# Patient Record
Sex: Male | Born: 1940 | Race: White | Hispanic: No | Smoking: Former smoker
Health system: Southern US, Community
[De-identification: ages and names within clinical notes are randomized; demographics above are authoritative.]

## PROBLEM LIST (undated history)

## (undated) DIAGNOSIS — N200 Calculus of kidney: Secondary | ICD-10-CM

## (undated) DIAGNOSIS — Z86010 Personal history of colonic polyps: Secondary | ICD-10-CM

## (undated) DIAGNOSIS — C3492 Malignant neoplasm of unspecified part of left bronchus or lung: Secondary | ICD-10-CM

## (undated) DIAGNOSIS — R918 Other nonspecific abnormal finding of lung field: Principal | ICD-10-CM

## (undated) DIAGNOSIS — K648 Other hemorrhoids: Secondary | ICD-10-CM

## (undated) DIAGNOSIS — F410 Panic disorder [episodic paroxysmal anxiety] without agoraphobia: Secondary | ICD-10-CM

## (undated) DIAGNOSIS — G8929 Other chronic pain: Secondary | ICD-10-CM

## (undated) DIAGNOSIS — M1990 Unspecified osteoarthritis, unspecified site: Secondary | ICD-10-CM

## (undated) DIAGNOSIS — R51 Headache: Secondary | ICD-10-CM

## (undated) DIAGNOSIS — Z860101 Personal history of adenomatous and serrated colon polyps: Secondary | ICD-10-CM

## (undated) DIAGNOSIS — R55 Syncope and collapse: Secondary | ICD-10-CM

## (undated) DIAGNOSIS — K861 Other chronic pancreatitis: Secondary | ICD-10-CM

## (undated) DIAGNOSIS — F1020 Alcohol dependence, uncomplicated: Secondary | ICD-10-CM

## (undated) DIAGNOSIS — R109 Unspecified abdominal pain: Secondary | ICD-10-CM

## (undated) DIAGNOSIS — Z515 Encounter for palliative care: Secondary | ICD-10-CM

## (undated) DIAGNOSIS — K5790 Diverticulosis of intestine, part unspecified, without perforation or abscess without bleeding: Secondary | ICD-10-CM

## (undated) DIAGNOSIS — K219 Gastro-esophageal reflux disease without esophagitis: Secondary | ICD-10-CM

## (undated) DIAGNOSIS — R519 Headache, unspecified: Secondary | ICD-10-CM

## (undated) DIAGNOSIS — M47816 Spondylosis without myelopathy or radiculopathy, lumbar region: Secondary | ICD-10-CM

## (undated) DIAGNOSIS — IMO0001 Reserved for inherently not codable concepts without codable children: Secondary | ICD-10-CM

## (undated) DIAGNOSIS — C61 Malignant neoplasm of prostate: Secondary | ICD-10-CM

## (undated) HISTORY — DX: Malignant neoplasm of prostate: C61

## (undated) HISTORY — DX: Other hemorrhoids: K64.8

## (undated) HISTORY — DX: Personal history of adenomatous and serrated colon polyps: Z86.0101

## (undated) HISTORY — DX: Unspecified abdominal pain: R10.9

## (undated) HISTORY — DX: Other chronic pain: G89.29

## (undated) HISTORY — DX: Unspecified osteoarthritis, unspecified site: M19.90

## (undated) HISTORY — DX: Headache: R51

## (undated) HISTORY — DX: Headache, unspecified: R51.9

## (undated) HISTORY — DX: Gastro-esophageal reflux disease without esophagitis: K21.9

## (undated) HISTORY — DX: Malignant neoplasm of unspecified part of left bronchus or lung: C34.92

## (undated) HISTORY — DX: Other nonspecific abnormal finding of lung field: R91.8

## (undated) HISTORY — DX: Spondylosis without myelopathy or radiculopathy, lumbar region: M47.816

## (undated) HISTORY — PX: WOUND EXPLORATION: SHX2669

## (undated) HISTORY — DX: Other chronic pancreatitis: K86.1

## (undated) HISTORY — DX: Panic disorder (episodic paroxysmal anxiety): F41.0

## (undated) HISTORY — PX: TONSILLECTOMY: SUR1361

## (undated) HISTORY — DX: Calculus of kidney: N20.0

## (undated) HISTORY — DX: Personal history of colonic polyps: Z86.010

## (undated) HISTORY — PX: CHOLECYSTECTOMY: SHX55

## (undated) HISTORY — DX: Syncope and collapse: R55

## (undated) HISTORY — DX: Diverticulosis of intestine, part unspecified, without perforation or abscess without bleeding: K57.90

## (undated) HISTORY — DX: Alcohol dependence, uncomplicated: F10.20

---

## 2000-01-08 ENCOUNTER — Ambulatory Visit (HOSPITAL_COMMUNITY): Admission: RE | Admit: 2000-01-08 | Discharge: 2000-01-08 | Payer: Self-pay | Admitting: Gastroenterology

## 2000-01-08 ENCOUNTER — Encounter (INDEPENDENT_AMBULATORY_CARE_PROVIDER_SITE_OTHER): Payer: Self-pay | Admitting: Specialist

## 2000-02-09 ENCOUNTER — Emergency Department (HOSPITAL_COMMUNITY): Admission: EM | Admit: 2000-02-09 | Discharge: 2000-02-09 | Payer: Self-pay | Admitting: Emergency Medicine

## 2000-02-09 ENCOUNTER — Encounter: Payer: Self-pay | Admitting: Emergency Medicine

## 2000-03-16 ENCOUNTER — Encounter: Admission: RE | Admit: 2000-03-16 | Discharge: 2000-03-16 | Payer: Self-pay | Admitting: Urology

## 2000-03-16 ENCOUNTER — Encounter: Payer: Self-pay | Admitting: Urology

## 2000-03-30 ENCOUNTER — Other Ambulatory Visit: Admission: RE | Admit: 2000-03-30 | Discharge: 2000-03-30 | Payer: Self-pay | Admitting: Urology

## 2000-03-30 ENCOUNTER — Encounter (INDEPENDENT_AMBULATORY_CARE_PROVIDER_SITE_OTHER): Payer: Self-pay | Admitting: Specialist

## 2000-04-07 ENCOUNTER — Encounter: Payer: Self-pay | Admitting: Urology

## 2000-04-07 ENCOUNTER — Encounter: Admission: RE | Admit: 2000-04-07 | Discharge: 2000-04-07 | Payer: Self-pay | Admitting: Urology

## 2000-04-30 ENCOUNTER — Encounter: Admission: RE | Admit: 2000-04-30 | Discharge: 2000-07-29 | Payer: Self-pay | Admitting: Radiation Oncology

## 2000-08-02 ENCOUNTER — Encounter: Admission: RE | Admit: 2000-08-02 | Discharge: 2000-10-31 | Payer: Self-pay | Admitting: Radiation Oncology

## 2001-04-01 ENCOUNTER — Encounter: Admission: RE | Admit: 2001-04-01 | Discharge: 2001-04-01 | Payer: Self-pay | Admitting: Internal Medicine

## 2001-04-01 ENCOUNTER — Encounter: Payer: Self-pay | Admitting: Internal Medicine

## 2001-04-11 ENCOUNTER — Encounter: Payer: Self-pay | Admitting: Surgery

## 2001-04-11 ENCOUNTER — Encounter: Admission: RE | Admit: 2001-04-11 | Discharge: 2001-04-11 | Payer: Self-pay | Admitting: Surgery

## 2001-04-22 ENCOUNTER — Encounter (INDEPENDENT_AMBULATORY_CARE_PROVIDER_SITE_OTHER): Payer: Self-pay | Admitting: Specialist

## 2001-04-22 ENCOUNTER — Observation Stay (HOSPITAL_COMMUNITY): Admission: RE | Admit: 2001-04-22 | Discharge: 2001-04-25 | Payer: Self-pay | Admitting: Surgery

## 2001-04-23 ENCOUNTER — Encounter: Payer: Self-pay | Admitting: Surgery

## 2001-07-07 ENCOUNTER — Encounter: Admission: RE | Admit: 2001-07-07 | Discharge: 2001-07-07 | Payer: Self-pay | Admitting: Neurosurgery

## 2001-07-07 ENCOUNTER — Encounter: Payer: Self-pay | Admitting: Internal Medicine

## 2002-01-02 ENCOUNTER — Ambulatory Visit (HOSPITAL_COMMUNITY): Admission: RE | Admit: 2002-01-02 | Discharge: 2002-01-02 | Payer: Self-pay | Admitting: Gastroenterology

## 2002-01-02 ENCOUNTER — Encounter: Payer: Self-pay | Admitting: Internal Medicine

## 2002-02-21 ENCOUNTER — Encounter: Admission: RE | Admit: 2002-02-21 | Discharge: 2002-02-21 | Payer: Self-pay | Admitting: Surgery

## 2002-02-21 ENCOUNTER — Encounter: Payer: Self-pay | Admitting: Surgery

## 2002-03-13 ENCOUNTER — Encounter: Payer: Self-pay | Admitting: Gastroenterology

## 2002-03-13 ENCOUNTER — Ambulatory Visit (HOSPITAL_COMMUNITY): Admission: RE | Admit: 2002-03-13 | Discharge: 2002-03-13 | Payer: Self-pay

## 2002-05-30 ENCOUNTER — Encounter: Admission: RE | Admit: 2002-05-30 | Discharge: 2002-06-13 | Payer: Self-pay

## 2002-06-19 ENCOUNTER — Encounter: Admission: RE | Admit: 2002-06-19 | Discharge: 2002-09-17 | Payer: Self-pay | Admitting: Anesthesiology

## 2004-07-29 ENCOUNTER — Ambulatory Visit: Payer: Self-pay | Admitting: Gastroenterology

## 2004-08-05 ENCOUNTER — Ambulatory Visit: Payer: Self-pay | Admitting: Internal Medicine

## 2004-08-11 ENCOUNTER — Ambulatory Visit (HOSPITAL_COMMUNITY): Admission: RE | Admit: 2004-08-11 | Discharge: 2004-08-11 | Payer: Self-pay | Admitting: Internal Medicine

## 2004-08-11 ENCOUNTER — Ambulatory Visit: Payer: Self-pay | Admitting: Internal Medicine

## 2004-08-11 ENCOUNTER — Encounter (INDEPENDENT_AMBULATORY_CARE_PROVIDER_SITE_OTHER): Payer: Self-pay | Admitting: *Deleted

## 2004-08-11 HISTORY — PX: ESOPHAGOGASTRODUODENOSCOPY: SHX1529

## 2004-08-26 ENCOUNTER — Ambulatory Visit: Payer: Self-pay | Admitting: Internal Medicine

## 2004-08-29 ENCOUNTER — Ambulatory Visit (HOSPITAL_COMMUNITY): Admission: RE | Admit: 2004-08-29 | Discharge: 2004-08-29 | Payer: Self-pay | Admitting: Internal Medicine

## 2005-05-31 ENCOUNTER — Emergency Department (HOSPITAL_COMMUNITY): Admission: EM | Admit: 2005-05-31 | Discharge: 2005-06-01 | Payer: Self-pay | Admitting: Emergency Medicine

## 2005-09-14 HISTORY — PX: EUS: SHX5427

## 2005-10-05 ENCOUNTER — Ambulatory Visit: Payer: Self-pay | Admitting: Internal Medicine

## 2005-10-15 ENCOUNTER — Ambulatory Visit (HOSPITAL_COMMUNITY): Admission: RE | Admit: 2005-10-15 | Discharge: 2005-10-15 | Payer: Self-pay | Admitting: Gastroenterology

## 2005-10-15 ENCOUNTER — Ambulatory Visit: Payer: Self-pay | Admitting: Gastroenterology

## 2007-01-21 ENCOUNTER — Ambulatory Visit: Payer: Self-pay | Admitting: Internal Medicine

## 2007-01-21 LAB — CONVERTED CEMR LAB
ALT: 13 units/L (ref 0–40)
Alkaline Phosphatase: 87 units/L (ref 39–117)
BUN: 8 mg/dL (ref 6–23)
Basophils Relative: 0.5 % (ref 0.0–1.0)
CO2: 28 meq/L (ref 19–32)
Calcium: 8.6 mg/dL (ref 8.4–10.5)
Creatinine, Ser: 1.1 mg/dL (ref 0.4–1.5)
Eosinophils Relative: 1.7 % (ref 0.0–5.0)
Glucose, Bld: 108 mg/dL — ABNORMAL HIGH (ref 70–99)
Hemoglobin: 15.5 g/dL (ref 13.0–17.0)
Monocytes Absolute: 0.4 10*3/uL (ref 0.2–0.7)
Monocytes Relative: 7.6 % (ref 3.0–11.0)
Potassium: 4.2 meq/L (ref 3.5–5.1)
RDW: 12.7 % (ref 11.5–14.6)
Total Bilirubin: 0.4 mg/dL (ref 0.3–1.2)
Total Protein: 5.8 g/dL — ABNORMAL LOW (ref 6.0–8.3)
WBC: 5.5 10*3/uL (ref 4.5–10.5)

## 2007-01-25 ENCOUNTER — Ambulatory Visit: Payer: Self-pay | Admitting: Cardiology

## 2008-01-18 ENCOUNTER — Emergency Department (HOSPITAL_COMMUNITY): Admission: EM | Admit: 2008-01-18 | Discharge: 2008-01-18 | Payer: Self-pay | Admitting: Family Medicine

## 2008-03-08 ENCOUNTER — Emergency Department (HOSPITAL_COMMUNITY): Admission: EM | Admit: 2008-03-08 | Discharge: 2008-03-09 | Payer: Self-pay | Admitting: Emergency Medicine

## 2008-03-12 ENCOUNTER — Encounter: Payer: Self-pay | Admitting: Internal Medicine

## 2008-03-28 ENCOUNTER — Emergency Department (HOSPITAL_COMMUNITY): Admission: EM | Admit: 2008-03-28 | Discharge: 2008-03-28 | Payer: Self-pay | Admitting: Emergency Medicine

## 2008-05-23 DIAGNOSIS — F411 Generalized anxiety disorder: Secondary | ICD-10-CM | POA: Insufficient documentation

## 2008-05-23 DIAGNOSIS — R519 Headache, unspecified: Secondary | ICD-10-CM | POA: Insufficient documentation

## 2008-05-23 DIAGNOSIS — Z87442 Personal history of urinary calculi: Secondary | ICD-10-CM | POA: Insufficient documentation

## 2008-05-23 DIAGNOSIS — F410 Panic disorder [episodic paroxysmal anxiety] without agoraphobia: Secondary | ICD-10-CM | POA: Insufficient documentation

## 2008-05-23 DIAGNOSIS — C61 Malignant neoplasm of prostate: Secondary | ICD-10-CM

## 2008-05-23 DIAGNOSIS — M129 Arthropathy, unspecified: Secondary | ICD-10-CM | POA: Insufficient documentation

## 2008-05-23 DIAGNOSIS — R51 Headache: Secondary | ICD-10-CM

## 2008-05-25 ENCOUNTER — Ambulatory Visit: Payer: Self-pay | Admitting: Internal Medicine

## 2008-05-25 DIAGNOSIS — Z860100 Personal history of colon polyps, unspecified: Secondary | ICD-10-CM | POA: Insufficient documentation

## 2008-05-25 DIAGNOSIS — F1020 Alcohol dependence, uncomplicated: Secondary | ICD-10-CM

## 2008-05-25 DIAGNOSIS — K861 Other chronic pancreatitis: Secondary | ICD-10-CM | POA: Insufficient documentation

## 2008-05-25 DIAGNOSIS — R109 Unspecified abdominal pain: Secondary | ICD-10-CM

## 2008-05-28 LAB — CONVERTED CEMR LAB
Amylase: 68 units/L (ref 27–131)
CO2: 31 meq/L (ref 19–32)
Creatinine, Ser: 1.2 mg/dL (ref 0.4–1.5)
GFR calc Af Amer: 78 mL/min
GFR calc non Af Amer: 64 mL/min
Glucose, Bld: 106 mg/dL — ABNORMAL HIGH (ref 70–99)
Lipase: 13 units/L (ref 11.0–59.0)
Total Bilirubin: 0.6 mg/dL (ref 0.3–1.2)

## 2008-05-29 ENCOUNTER — Ambulatory Visit: Payer: Self-pay | Admitting: Cardiology

## 2008-06-01 ENCOUNTER — Telehealth (INDEPENDENT_AMBULATORY_CARE_PROVIDER_SITE_OTHER): Payer: Self-pay

## 2008-06-25 ENCOUNTER — Ambulatory Visit: Payer: Self-pay | Admitting: Internal Medicine

## 2008-09-27 ENCOUNTER — Telehealth: Payer: Self-pay | Admitting: Internal Medicine

## 2008-12-05 DIAGNOSIS — M479 Spondylosis, unspecified: Secondary | ICD-10-CM | POA: Insufficient documentation

## 2008-12-05 DIAGNOSIS — N200 Calculus of kidney: Secondary | ICD-10-CM | POA: Insufficient documentation

## 2008-12-13 ENCOUNTER — Emergency Department (HOSPITAL_COMMUNITY): Admission: EM | Admit: 2008-12-13 | Discharge: 2008-12-14 | Payer: Self-pay | Admitting: Emergency Medicine

## 2008-12-24 ENCOUNTER — Encounter: Payer: Self-pay | Admitting: Cardiology

## 2008-12-24 ENCOUNTER — Ambulatory Visit: Payer: Self-pay | Admitting: Cardiology

## 2008-12-24 DIAGNOSIS — R55 Syncope and collapse: Secondary | ICD-10-CM | POA: Insufficient documentation

## 2008-12-24 DIAGNOSIS — R079 Chest pain, unspecified: Secondary | ICD-10-CM

## 2008-12-24 DIAGNOSIS — R0602 Shortness of breath: Secondary | ICD-10-CM | POA: Insufficient documentation

## 2008-12-24 LAB — CONVERTED CEMR LAB
Albumin: 3.3 g/dL — ABNORMAL LOW (ref 3.5–5.2)
Alkaline Phosphatase: 98 units/L (ref 39–117)
Bilirubin, Direct: 0.2 mg/dL (ref 0.0–0.3)
Calcium: 8.8 mg/dL (ref 8.4–10.5)
Direct LDL: 150.6 mg/dL
GFR calc non Af Amer: 63.97 mL/min (ref >60)
Glucose, Bld: 140 mg/dL — ABNORMAL HIGH (ref 70–99)
HDL: 31.2 mg/dL — ABNORMAL LOW (ref >39.00)
Sodium: 144 meq/L (ref 135–145)
VLDL: 32.2 mg/dL (ref 0.0–40.0)

## 2009-01-03 ENCOUNTER — Telehealth (INDEPENDENT_AMBULATORY_CARE_PROVIDER_SITE_OTHER): Payer: Self-pay

## 2009-01-07 ENCOUNTER — Encounter: Payer: Self-pay | Admitting: Cardiology

## 2009-01-07 ENCOUNTER — Ambulatory Visit: Payer: Self-pay

## 2009-01-07 ENCOUNTER — Ambulatory Visit: Payer: Self-pay | Admitting: Cardiology

## 2009-01-16 ENCOUNTER — Telehealth: Payer: Self-pay | Admitting: Cardiology

## 2009-02-08 ENCOUNTER — Ambulatory Visit: Payer: Self-pay | Admitting: Cardiology

## 2009-02-08 DIAGNOSIS — E785 Hyperlipidemia, unspecified: Secondary | ICD-10-CM

## 2009-02-08 DIAGNOSIS — R569 Unspecified convulsions: Secondary | ICD-10-CM

## 2009-03-12 ENCOUNTER — Telehealth: Payer: Self-pay | Admitting: Cardiology

## 2009-04-26 ENCOUNTER — Encounter (INDEPENDENT_AMBULATORY_CARE_PROVIDER_SITE_OTHER): Payer: Self-pay | Admitting: *Deleted

## 2009-10-29 ENCOUNTER — Encounter: Payer: Self-pay | Admitting: Internal Medicine

## 2009-12-04 ENCOUNTER — Ambulatory Visit: Payer: Self-pay | Admitting: Internal Medicine

## 2009-12-04 ENCOUNTER — Encounter (INDEPENDENT_AMBULATORY_CARE_PROVIDER_SITE_OTHER): Payer: Self-pay | Admitting: *Deleted

## 2009-12-10 ENCOUNTER — Ambulatory Visit: Payer: Self-pay | Admitting: Internal Medicine

## 2009-12-10 HISTORY — PX: COLONOSCOPY W/ POLYPECTOMY: SHX1380

## 2009-12-16 ENCOUNTER — Encounter: Payer: Self-pay | Admitting: Internal Medicine
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
## 2010-10-14 NOTE — Letter (Signed)
Summary: [facility]  [facility]   Imported By: Lester [place] 11/11/2009 10:34:12  _____________________________________________________________________  External Attachment:    Type:   Image     Comment:   External Document

## 2010-10-14 NOTE — Letter (Signed)
Summary: [facility] Instructions  [facility]  [address] [place], [place] [address]   Phone: [phone]  Fax: [phone]       Gary Sweeney    [birth year]    MRN: [id number]      Procedure Day Dorna Bloom: Jake Shark, 12/10/09     Arrival Time: 1:00 PM       Procedure Time: 2:00 PM    Location of Procedure:                    _X_  [facility] (4th Floor)                      PREPARATION FOR COLONOSCOPY WITH MOVIPREP   Starting 5 days prior to your procedure 12/05/09 do not eat nuts, seeds, popcorn, corn, beans, peas,  salads, or any raw vegetables.  Do not take any fiber supplements (e.g. Metamucil, Citrucel, and Benefiber).  THE DAY BEFORE YOUR PROCEDURE         MONDAY, 12/09/09  1.  Drink clear liquids the entire day-NO SOLID FOOD  2.  Do not drink anything colored red or purple.  Avoid juices with pulp.  No orange juice.  3.  Drink at least 64 oz. (8 glasses) of fluid/clear liquids during the day to prevent dehydration and help the prep work efficiently.  CLEAR LIQUIDS INCLUDE: Water Jello Ice Popsicles Tea (sugar ok, no milk/cream) Powdered fruit flavored drinks Coffee (sugar ok, no milk/cream) Gatorade Juice: apple, white grape, white cranberry  Lemonade Clear bullion, consomm, broth Carbonated beverages (any kind) Strained chicken noodle soup Hard Candy                             4.  In the morning, mix first dose of MoviPrep solution:    Empty 1 Pouch A and 1 Pouch B into the disposable container    Add lukewarm drinking water to the top line of the container. Mix to dissolve    Refrigerate (mixed solution should be used within 24 hrs)  5.  Begin drinking the prep at 5:00 p.m. The MoviPrep container is divided by 4 marks.   Every 15 minutes drink the solution down to the next mark (approximately 8 oz) until the full liter is complete.   6.  Follow completed prep with 16 oz of clear liquid of your choice (Nothing red or purple).   Continue to drink clear liquids until bedtime.  7.  Before going to bed, mix second dose of MoviPrep solution:    Empty 1 Pouch A and 1 Pouch B into the disposable container    Add lukewarm drinking water to the top line of the container. Mix to dissolve    Refrigerate  THE DAY OF YOUR PROCEDURE      TUESDAY, 12/10/09  Beginning at 9:00 a.m. (5 hours before procedure):         1. Every 15 minutes, drink the solution down to the next mark (approx 8 oz) until the full liter is complete.  2. Follow completed prep with 16 oz. of clear liquid of your choice.    3. You may drink clear liquids until 12:00 PM (2 HOURS BEFORE PROCEDURE).  MEDICATION INSTRUCTIONS  Unless otherwise instructed, you should take regular prescription medications with a small sip of water   as early as possible the morning of your procedure.       OTHER INSTRUCTIONS  You will need a responsible adult at least 70 years of age to accompany you and drive you home.   This person must remain in the waiting room during your procedure.  Wear loose fitting clothing that is easily removed.  Leave jewelry and other valuables at home.  However, you may wish to bring a book to read or  an iPod/MP3 player to listen to music as you wait for your procedure to start.  Remove all body piercing jewelry and leave at home.  Total time from sign-in until discharge is approximately 2-3 hours.  You should go home directly after your procedure and rest.  You can resume normal activities the  day after your procedure.  The day of your procedure you should not:   Drive   Make legal decisions   Operate machinery   Drink alcohol   Return to work  You will receive specific instructions about eating, activities and medications before you leave.  The above instructions have been reviewed and explained to me by   _______________________   I fully understand and can verbalize these instructions  _____________________________ Date _________

## 2010-10-14 NOTE — Letter (Signed)
Summary: Patient Notice- Polyp Results  [facility]  [address] [place], [place] [address]   Phone: [phone]  Fax: [phone]        December 16, 2009 MRN: [id number]    Gary Sweeney [address] [place], [place]  [address]    Dear [name],  The polyps removed from your colon were adenomatous. This means that they were pre-cancerous or that  they had the potential to change into cancer over time.   I recommend that you have a repeat colonoscopy in 1 year to determine if you have developed any new polyps over time. If you develop any new rectal bleeding, abdominal pain or significant bowel habit changes, please contact us before then.  In addition to repeating colonoscopy, changing health habits may reduce your risk of having more colon polyps and possibly, colon cancer. You may lower your risk of future polyps and colon cancer by adopting healthy habits such as not smoking or using tobacco (if you do), being physically active, losing weight (if overweight), and eating a diet which includes fruits and vegetables and limits red meat.  Please call [phone] to schedule a return visit to review your      situation and response to nortriptyline prescribed for your abdominal pain. I would like to see you in June, 2011.  Continue treatment plan as outlined the day of your exam.  Please call us if you are having persistent problems or have questions about your condition that have not been fully answered at this time.  Sincerely,  Iva Boop MD, [facility]  This letter has been electronically signed by your physician.  Appended Document: Patient Notice- Polyp Results Letter mailed 4.5.11

## 2010-10-14 NOTE — Miscellaneous (Signed)
Summary: nortriptylline rx  Clinical Lists Changes  Medications: Removed medication of MOVIPREP 100 GM  SOLR (PEG-KCL-NACL-NASULF-NA ASC-C) As per prep instructions. Added new medication of NORTRIPYTLINE HCL 25 MG CAPS (NORTRIPTYLINE HCL) 1 by mouth qhs - Signed Rx of NORTRIPYTLINE HCL 25 MG CAPS (NORTRIPTYLINE HCL) 1 by mouth qhs;  #30 x 2;  Signed;  Entered by: Iva Boop MD, [facility];  Authorized by: Iva Boop MD, [facility];  Method used: Electronically to [facility] [id number], [place], [place], [place]  [address], Ph: [phone], Fax: [phone]    Prescriptions: NORTRIPYTLINE HCL 25 MG CAPS (NORTRIPTYLINE HCL) 1 by mouth qhs  #30 x 2   Entered and Authorized by:   Iva Boop MD, [facility]   Signed by:   Iva Boop MD, FACG on 12/10/2009   Method used:   Electronically to        [facility] [address] (retail)       [address] [place], [place]  [address]       Ph: [phone]       Fax: [phone]   RxID:   [id number]

## 2010-10-14 NOTE — Procedures (Signed)
Summary: Colonoscopy  Patient: Gary Sweeney Note: All result statuses are Final unless otherwise noted.  Tests: (1) Colonoscopy (COL)   COL Colonoscopy           DONE     [facility]     [address]     [place], [place]  [address]           COLONOSCOPY PROCEDURE REPORT           PATIENT:  Gary, Sweeney  MR#:  [id number]     BIRTHDATE:  [birth year], 69 yrs. old  GENDER:  male     ENDOSCOPIST:  Iva Boop, MD, [facility]     REF. BY:     PROCEDURE DATE:  12/10/2009     PROCEDURE:  Colonoscopy with snare polypectomy     ASA CLASS:  Class II     INDICATIONS:  surveillance and screening, history of pre-cancerous     (adenomatous) colon polyps, family history of colon cancer 2003-     tubulovillous adenoma     mother with colon cancer     MEDICATIONS:   Fentanyl 75 mcg IV, Versed 8 mg IV, Benadryl 12.5     mg IV           DESCRIPTION OF PROCEDURE:   After the risks benefits and     alternatives of the procedure were thoroughly explained, informed     consent was obtained.  Digital rectal exam was performed and     revealed no abnormalities.  Prostate s/p XRT. The LB CF-[id number]     [id number] endoscope was introduced through the anus and advanced to     the cecum, which was identified by both the appendix and ileocecal     valve, without limitations.  The quality of the prep was     excellent, using MoviPrep.  The instrument was then slowly     withdrawn as the colon was fully examined.     Insertion: 4:00 minutes Withdrawal: 13:11 minutes     <<PROCEDUREIMAGES>>           FINDINGS:  There were multiple polyps identified and removed. Nine     polyps removed. Minimum size 6mm, maximum 15 mm. cecum (1),     ascending (1), transverse (3), splenic flexure (1), descending     (2), rectum (1). Polyps were snared, then cauterized with     monopolar cautery. Retrieval was successful. Polyps were also     snared without cautery. Retrieval was successful. Severe  diverticulosis was found in the left colon.  This was otherwise a     normal examination of the colon.   Retroflexed views in the rectum     revealed internal hemorrhoids.    The scope was then withdrawn     from the patient and the procedure completed.           COMPLICATIONS:  None     ENDOSCOPIC IMPRESSION:     1) Polyps, multiple - 9 removed today sizes range from 6mm to 15     mm     2) Severe diverticulosis in the left colon     3) Internal hemorrhoids     4) Otherwise normal examination           5) prior tubulovillous adenoma removal 2003     6) mother had colon cancer           RECOMMENDATIONS:     1) No aspirin or NSAID's  for 2 weeks     Nortriptylline 25 mg at bedtime for chronic abdominal pain.     Prescription sent to pharmacy.           REPEAT EXAM:  In for Colonoscopy, pending biopsy results.           Iva Boop, MD, [facility]           CC:  Lynnea Ferrier, MD     The Patient     Gaynelle Arabian, MD           n.     Rosalie Doctor:   Iva Boop at 12/10/2009 03:27 PM           Marjory Lies, [phone]  Note: An exclamation mark (!) indicates a result that was not dispersed into the flowsheet. Document Creation Date: 12/10/2009 3:28 PM _______________________________________________________________________  (1) Order result status: Final Collection or observation date-time: 12/10/2009 15:16 Requested date-time:  Receipt date-time:  Reported date-time:  Referring Physician:   Ordering Physician: Stan Head [phone]) Specimen Source:  Source: Launa Grill Order Number: [id number] Lab site:   Appended Document: Colonoscopy     Procedures Next Due Date:    Colonoscopy: 12/2010

## 2010-11-27 ENCOUNTER — Encounter: Payer: Self-pay | Admitting: Internal Medicine

## 2010-12-02 NOTE — Letter (Signed)
Summary: Colonoscopy-Changed to Office Visit Letter  [facility]  [address]   [place], [place] [address]   Phone: [phone]  Fax: [phone]      November 27, 2010 MRN: [id number]   TRYSTIN TERHUNE [address] [place], [place]  [address]   Dear Mr. Schadler,   According to our records, it is time for you to schedule a Colonoscopy. However, after reviewing your medical record, I feel that an office visit would be most appropriate to more completely evaluate you and determine your need for a repeat procedure.  Please call [phone] (option #2) at your convenience to schedule an office visit. If you have any questions, concerns, or feel that this letter is in error, we would appreciate your call.   Sincerely,   Stan Head, M.D.  [facility] [phone]

## 2010-12-24 LAB — COMPREHENSIVE METABOLIC PANEL
AST: 25 U/L (ref 0–37)
Albumin: 3.2 g/dL — ABNORMAL LOW (ref 3.5–5.2)
Calcium: 8.8 mg/dL (ref 8.4–10.5)
Creatinine, Ser: 1.17 mg/dL (ref 0.4–1.5)
GFR calc Af Amer: >60 mL/min (ref >60)
Total Protein: 6 g/dL (ref 6.0–8.3)

## 2010-12-24 LAB — DIFFERENTIAL
Eosinophils Relative: 1 % (ref 0–5)
Lymphocytes Relative: 16 % (ref 12–46)
Lymphs Abs: 1.1 10*3/uL (ref 0.7–4.0)
Monocytes Absolute: 0.5 10*3/uL (ref 0.1–1.0)

## 2010-12-24 LAB — CBC
MCHC: 33.5 g/dL (ref 30.0–36.0)
MCV: 92.3 fL (ref 78.0–100.0)
Platelets: 146 10*3/uL — ABNORMAL LOW (ref 150–400)
RDW: 13.8 % (ref 11.5–15.5)
WBC: 7 10*3/uL (ref 4.0–10.5)

## 2010-12-24 LAB — RAPID URINE DRUG SCREEN, HOSP PERFORMED
Amphetamines: NOT DETECTED
Barbiturates: NOT DETECTED
Benzodiazepines: POSITIVE — AB
Opiates: NOT DETECTED

## 2010-12-24 LAB — POCT CARDIAC MARKERS
CKMB, poc: 1.6 ng/mL (ref 1.0–8.0)
Troponin i, poc: <0.05 ng/mL (ref 0.00–0.09)

## 2010-12-24 LAB — URINALYSIS, ROUTINE W REFLEX MICROSCOPIC
Glucose, UA: NEGATIVE mg/dL
Hgb urine dipstick: NEGATIVE
Protein, ur: NEGATIVE mg/dL

## 2011-01-12 ENCOUNTER — Other Ambulatory Visit (INDEPENDENT_AMBULATORY_CARE_PROVIDER_SITE_OTHER): Payer: Medicaid Other

## 2011-01-12 ENCOUNTER — Encounter: Payer: Self-pay | Admitting: Internal Medicine

## 2011-01-12 ENCOUNTER — Other Ambulatory Visit: Payer: Self-pay | Admitting: Internal Medicine

## 2011-01-12 ENCOUNTER — Ambulatory Visit (INDEPENDENT_AMBULATORY_CARE_PROVIDER_SITE_OTHER): Payer: Medicaid Other | Admitting: Internal Medicine

## 2011-01-12 DIAGNOSIS — K219 Gastro-esophageal reflux disease without esophagitis: Secondary | ICD-10-CM

## 2011-01-12 DIAGNOSIS — F1020 Alcohol dependence, uncomplicated: Secondary | ICD-10-CM

## 2011-01-12 DIAGNOSIS — R109 Unspecified abdominal pain: Secondary | ICD-10-CM

## 2011-01-12 DIAGNOSIS — K861 Other chronic pancreatitis: Secondary | ICD-10-CM

## 2011-01-12 DIAGNOSIS — Z860100 Personal history of colon polyps, unspecified: Secondary | ICD-10-CM

## 2011-01-12 DIAGNOSIS — Z1211 Encounter for screening for malignant neoplasm of colon: Secondary | ICD-10-CM

## 2011-01-12 LAB — COMPREHENSIVE METABOLIC PANEL
ALT: 14 U/L (ref 0–53)
AST: 14 U/L (ref 0–37)
Albumin: 3.3 g/dL — ABNORMAL LOW (ref 3.5–5.2)
Alkaline Phosphatase: 100 U/L (ref 39–117)
BUN: 10 mg/dL (ref 6–23)
Calcium: 9.2 mg/dL (ref 8.4–10.5)
Chloride: 100 mEq/L (ref 96–112)
Potassium: 4.6 mEq/L (ref 3.5–5.1)
Sodium: 141 mEq/L (ref 135–145)
Total Protein: 6.2 g/dL (ref 6.0–8.3)

## 2011-01-12 LAB — CBC WITH DIFFERENTIAL/PLATELET
Basophils Relative: 0.4 % (ref 0.0–3.0)
Eosinophils Absolute: 0.1 10*3/uL (ref 0.0–0.7)
Lymphocytes Relative: 30.7 % (ref 12.0–46.0)
MCHC: 34.1 g/dL (ref 30.0–36.0)
Neutrophils Relative %: 60.5 % (ref 43.0–77.0)
Platelets: 224 10*3/uL (ref 150.0–400.0)
RBC: 5.35 Mil/uL (ref 4.22–5.81)
WBC: 6.9 10*3/uL (ref 4.5–10.5)

## 2011-01-12 LAB — AMYLASE: Amylase: 49 U/L (ref 27–131)

## 2011-01-12 MED ORDER — PEG-KCL-NACL-NASULF-NA ASC-C 100 G PO SOLR: 1.0000 | Freq: Once | ORAL | Status: AC

## 2011-01-12 MED ORDER — DICYCLOMINE HCL 20 MG PO TABS: 20.0000 mg | ORAL_TABLET | Freq: Three times a day (TID) | ORAL | Status: DC

## 2011-01-12 MED ORDER — LANSOPRAZOLE 30 MG PO CPDR: 30.0000 mg | DELAYED_RELEASE_CAPSULE | Freq: Every day | ORAL | Status: DC

## 2011-01-12 NOTE — Patient Instructions (Addendum)
Do not buy medications on the street. Only take medications purchased through a pharmacy. Your prescription(s) have been sent to you pharmacy.  Please go to the basement today for your labs.  Your procedure has been scheduled for 01/21/2011, please follow the seperate instructions.

## 2011-01-12 NOTE — Assessment & Plan Note (Addendum)
Chronic, mostly left sided this seems unchanged based upon my interactions with him over the years. I am not sure there is much more that could be done in this setting. He was advised not to purchase narcotics on the street.

## 2011-01-13 ENCOUNTER — Other Ambulatory Visit: Payer: Medicaid Other

## 2011-01-14 ENCOUNTER — Encounter: Payer: Self-pay | Admitting: Internal Medicine

## 2011-01-14 NOTE — Assessment & Plan Note (Signed)
We'll prescribe lansoprazole 30 mg daily to treat his GERD symptoms. He is to stop the baking soda.

## 2011-01-14 NOTE — Assessment & Plan Note (Signed)
No intervention for this today. Continue nortriptyline may need to increase the dose. This presumes compliance which is questionable.

## 2011-01-14 NOTE — Assessment & Plan Note (Signed)
He claims abstinence.
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
## 2011-01-14 NOTE — Progress Notes (Signed)
Quick Note:  Will review at colonoscopy ______

## 2011-01-14 NOTE — Assessment & Plan Note (Signed)
Given that he had 9 adenomas some with villous pathology, plan on a routine repeat colonoscopy for screening and surveillance.

## 2011-01-20 ENCOUNTER — Telehealth: Payer: Self-pay | Admitting: Internal Medicine

## 2011-01-20 ENCOUNTER — Telehealth: Payer: Self-pay

## 2011-01-20 ENCOUNTER — Encounter: Payer: Self-pay | Admitting: Internal Medicine

## 2011-01-20 NOTE — Telephone Encounter (Signed)
Patient's daughter called her father got confused about the Moviprep instructions.  He drank the prep this am instead of tonight.  I have advised his to take the second dose of prep this pm around 6 pm.  They will call back for any questions.

## 2011-01-20 NOTE — Telephone Encounter (Signed)
Pt not home. Left message to call us back.

## 2011-01-21 ENCOUNTER — Ambulatory Visit (AMBULATORY_SURGERY_CENTER): Payer: Medicaid Other | Admitting: Internal Medicine

## 2011-01-21 ENCOUNTER — Encounter: Payer: Self-pay | Admitting: Internal Medicine

## 2011-01-21 DIAGNOSIS — K648 Other hemorrhoids: Secondary | ICD-10-CM

## 2011-01-21 DIAGNOSIS — Z86010 Personal history of colonic polyps: Secondary | ICD-10-CM

## 2011-01-21 DIAGNOSIS — K5730 Diverticulosis of large intestine without perforation or abscess without bleeding: Secondary | ICD-10-CM

## 2011-01-21 DIAGNOSIS — Z1211 Encounter for screening for malignant neoplasm of colon: Secondary | ICD-10-CM

## 2011-01-21 DIAGNOSIS — K635 Polyp of colon: Secondary | ICD-10-CM

## 2011-01-21 DIAGNOSIS — D126 Benign neoplasm of colon, unspecified: Secondary | ICD-10-CM

## 2011-01-21 MED ORDER — SODIUM CHLORIDE 0.9 % IV SOLN: 500.0000 mL | INTRAVENOUS | Status: DC

## 2011-01-21 MED ORDER — NORTRIPTYLINE HCL 25 MG PO CAPS: ORAL_CAPSULE | ORAL | Status: DC

## 2011-01-21 NOTE — Patient Instructions (Signed)
You had two small polyps removed today. Also had diverticulosis and hemorrhoids. Your abdominal pain is due to scar tissue, nerve damage and pancreatic damage. Nortriptyline was increased to 50 mg nightly to help that. Do not drink alcohol.  Iva Boop, MD, [facility]

## 2011-01-22 ENCOUNTER — Telehealth: Payer: Self-pay | Admitting: *Deleted

## 2011-01-22 NOTE — Telephone Encounter (Signed)
Left mess. With niece ( carepartner) here with pt. yesterday.
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
## 2011-01-29 ENCOUNTER — Encounter: Payer: Self-pay | Admitting: Internal Medicine

## 2011-01-29 NOTE — Progress Notes (Signed)
Quick Note:  2 adenomas 5 year colon recall ______

## 2011-01-30 NOTE — Procedures (Signed)
[facility]  Patient:    Gary Sweeney, Gary Sweeney Visit Number: [id number] MRN: [id number]          Service Type: END Location: ENDO Attending Physician:  Dennison Bulla Ii Dictated by:   Verlin Grills, M.D. Proc. Date: 01/02/02 Admit Date:  01/02/2002   CC:         Mayra Neer, M.D. at [facility]   Procedure Report  PROCEDURE:  Screening colonoscopy.  PROCEDURE INDICATION:  Gary Sweeney is a 70 year old male born 1941/05/07. Gary Sweeney underwent a colonoscopy to remove neoplastic but noncancerous polyps, approximately three years ago. One of the polyps removed was a tubulovillous adenoma. Gary Sweeney is to undergo repeat screening colonoscopy with polypectomy to prevent colon cancer.  ENDOSCOPIST:  Verlin Grills, M.D.  PREMEDICATION:  Demerol 100 mg, Versed 10 mg.  ENDOSCOPE:  Olympus Pediatric colonoscope.  DESCRIPTION OF PROCEDURE:  After obtaining informed consent, Gary Sweeney was placed in the left lateral decubitus position. I administered intravenous Demerol and intravenous Versed to achieve conscious sedation for the procedure. The patients blood pressure, oxygen saturation, and cardiac rhythm were monitored throughout the procedure and documented in the medical record. Anal inspection was normal. Digital exam was normal. I did not palpate the prostate. The Olympus Pediatric video colonoscope was introduced into the rectum and easily advanced to the cecum. Colonic preparation for the exam today was excellent.  Gary Sweeney does have universal colonic diverticulosis without endoscopic signs for the presence of diverticulitis or diverticular stricture formation.  Rectum:  Normal.  Sigmoid colon and descending colon:  Normal.  Splenic flexure:  Normal.  Transverse colon:  Normal.  Hepatic flexure:  Normal.  Ascending colon:  Normal.  Cecum and ileocecal valve:   Normal.  ASSESSMENT:  Universal colonic diverticulosis; otherwise normal proctocolonoscopy to the cecum.  RECOMMENDATIONS:  Repeat colonoscopy in five years. Dictated by:   Verlin Grills, M.D. Attending Physician:  Dennison Bulla Ii DD:  01/02/02 TD:  01/02/02 Job: [id number]
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
## 2011-01-30 NOTE — Op Note (Signed)
[facility]  Patient:    [name], Gary Sweeney                   MRN: [id number] Proc. Date: 04/22/01 Adm. Date:  [id number] Attending:  Abigail Miyamoto A                           Operative Report  PREOPERATIVE DIAGNOSIS:  Symptomatic cholelithiasis.  POSTOPERATIVE DIAGNOSIS:  Symptomatic cholelithiasis.  PROCEDURE:  Laparoscopic cholecystectomy.  SURGEON:  Abigail Miyamoto, M.D.  ANESTHESIA:  General endotracheal and 0.25% Marcaine plain.  ESTIMATED BLOOD LOSS:  Minimal.  FINDINGS:  The patient was found to have a contracted gallbladder consistent with chronic cholecystitis as well as a mildly cirrhotic appearing liver.  DESCRIPTION OF PROCEDURE:  The patient was brought to the operating room and identified as Marjory Lies.  He was placed supine on the operating room table, and general anesthesia was induced.  His abdomen was then prepped and draped in the usual sterile fashion.  Using the #15 blade, a small, transverse incision was made below the umbilicus.  The incision was carried down to the fascia which was then opened with the scalpel.  A hemostat was then used to pass into the peritoneal cavity.  Next, a 0 Vicryl pursestring suture was placed around the fascial opening.  The Hasson port was then placed through the opening, and insufflation of the abdomen was begun.  An 11 mm port was then placed in the patients epigastrium, and two 5 mm ports were placed in the patients right flank under direct vision.  Inspection of the abdomen revealed a contracted gallbladder as well as a mildly cirrhotic liver.  The gallbladder was grasped and retracted above the liver.  Dissection was then carried down to the base.  The cystic duct was dissected out and clipped three times proximally and once distally and transected with the scissors.  The cystic artery was then identified and clipped twice proximally and once distally and transected as well.  The  gallbladder was then dissected free from the liver bed with the electrocautery.  Hemostasis was then achieved in the liver bed with the electrocautery and the help of Surgicel.  The gallbladder was then placed in an Endosac and removed through the umbilicus.  While placing the bag in the sac, there was a hole in the gallbladder, and several stones dropped out.  These stones could not be retrieved.  The abdomen was then copiously irrigated with normal saline.  All ports were then removed under direct vision, and the abdomen was deflated.  The 0 Vicryl at the umbilicus was then tied, closing the fascial defect.  All skin incisions were then anesthetized with 0.25% Marcaine and closed with 4-0 Monocryl subcuticular sutures.  Steri-Strips, gauze, and Tegaderm were then applied. The patient tolerated the procedure well.  All sponge, needle and instrument counts were correct at the end of the procedure.  The patient was then extubated in the operating room and taken in stable condition to the recovery room. DD:  04/22/01 TD:  04/23/01 Job: [id number]
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
## 2011-06-10 LAB — SEDIMENTATION RATE: Sed Rate: 4

## 2011-06-10 LAB — CBC
HCT: 52.8 — ABNORMAL HIGH
Platelets: 210
RBC: 5.59
WBC: 9.1

## 2011-06-10 LAB — DIFFERENTIAL
Lymphocytes Relative: 29
Lymphs Abs: 2.6
Neutrophils Relative %: 62

## 2011-06-11 LAB — DIFFERENTIAL
Basophils Relative: 1
Eosinophils Absolute: 0
Monocytes Relative: 6
Neutrophils Relative %: 77

## 2011-06-11 LAB — ETHANOL: Alcohol, Ethyl (B): 223 — ABNORMAL HIGH

## 2011-06-11 LAB — COMPREHENSIVE METABOLIC PANEL
ALT: 15
Alkaline Phosphatase: 87
CO2: 29
Chloride: 95 — ABNORMAL LOW
GFR calc non Af Amer: 54 — ABNORMAL LOW
Glucose, Bld: 140 — ABNORMAL HIGH
Potassium: 2.9 — ABNORMAL LOW
Sodium: 137

## 2011-06-11 LAB — CBC
Hemoglobin: 13.3
RBC: 4.16 — ABNORMAL LOW

## 2011-06-11 LAB — LIPASE, BLOOD: Lipase: 11

## 2011-06-11 LAB — POCT CARDIAC MARKERS: Operator id: 146091

## 2011-06-12 LAB — COMPREHENSIVE METABOLIC PANEL
Albumin: 2.7 — ABNORMAL LOW
BUN: 8
CO2: 28
Chloride: 106
Creatinine, Ser: 1.04
GFR calc non Af Amer: >60
Glucose, Bld: 140 — ABNORMAL HIGH
Total Bilirubin: 0.5

## 2011-06-12 LAB — URINE CULTURE

## 2011-06-12 LAB — URINALYSIS, ROUTINE W REFLEX MICROSCOPIC
Nitrite: NEGATIVE
Protein, ur: NEGATIVE
Specific Gravity, Urine: 1.011
Urobilinogen, UA: 1

## 2011-06-12 LAB — DIFFERENTIAL
Basophils Absolute: 0.1
Lymphocytes Relative: 19
Neutro Abs: 3.6
Neutrophils Relative %: 71

## 2011-06-12 LAB — CBC
HCT: 41.1
MCV: 94.4
Platelets: 158
WBC: 5

## 2011-06-12 LAB — LIPASE, BLOOD: Lipase: 15

## 2011-07-27 ENCOUNTER — Other Ambulatory Visit (HOSPITAL_COMMUNITY): Payer: Self-pay | Admitting: *Deleted

## 2011-07-27 ENCOUNTER — Other Ambulatory Visit: Payer: Self-pay | Admitting: Urology

## 2011-07-27 DIAGNOSIS — C61 Malignant neoplasm of prostate: Secondary | ICD-10-CM

## 2011-07-30 ENCOUNTER — Ambulatory Visit
Admission: RE | Admit: 2011-07-30 | Discharge: 2011-07-30 | Disposition: A | Discharge status: Home / Self Care | Payer: Medicaid Other | Source: Ambulatory Visit | Attending: Urology | Admitting: Urology

## 2011-07-30 DIAGNOSIS — C61 Malignant neoplasm of prostate: Secondary | ICD-10-CM

## 2011-07-30 MED ORDER — IOHEXOL 300 MG/ML  SOLN: 100.0000 mL | Freq: Once | INTRAMUSCULAR | Status: AC | PRN

## 2011-08-04 ENCOUNTER — Encounter (HOSPITAL_COMMUNITY)
Admission: RE | Admit: 2011-08-04 | Discharge: 2011-08-04 | Disposition: A | Discharge status: Home / Self Care | Payer: Medicare Other | Source: Ambulatory Visit | Attending: Urology | Admitting: Urology

## 2011-08-04 ENCOUNTER — Encounter (HOSPITAL_COMMUNITY): Payer: Self-pay

## 2011-08-04 ENCOUNTER — Other Ambulatory Visit (HOSPITAL_COMMUNITY): Payer: Self-pay | Admitting: Urology

## 2011-08-04 ENCOUNTER — Other Ambulatory Visit (HOSPITAL_COMMUNITY): Payer: Medicaid Other

## 2011-08-04 DIAGNOSIS — C61 Malignant neoplasm of prostate: Secondary | ICD-10-CM

## 2011-08-04 DIAGNOSIS — I700 Atherosclerosis of aorta: Secondary | ICD-10-CM | POA: Insufficient documentation

## 2011-08-04 DIAGNOSIS — K5730 Diverticulosis of large intestine without perforation or abscess without bleeding: Secondary | ICD-10-CM | POA: Insufficient documentation

## 2011-08-04 DIAGNOSIS — N200 Calculus of kidney: Secondary | ICD-10-CM | POA: Insufficient documentation

## 2011-08-04 DIAGNOSIS — R911 Solitary pulmonary nodule: Secondary | ICD-10-CM | POA: Insufficient documentation

## 2011-08-04 DIAGNOSIS — M47817 Spondylosis without myelopathy or radiculopathy, lumbosacral region: Secondary | ICD-10-CM | POA: Insufficient documentation

## 2011-08-04 DIAGNOSIS — I2510 Atherosclerotic heart disease of native coronary artery without angina pectoris: Secondary | ICD-10-CM | POA: Insufficient documentation

## 2011-08-04 DIAGNOSIS — Z9089 Acquired absence of other organs: Secondary | ICD-10-CM | POA: Insufficient documentation

## 2011-08-04 MED ORDER — TECHNETIUM TC 99M MEDRONATE IV KIT
23.9000 | PACK | Freq: Once | INTRAVENOUS | Status: AC | PRN
  Administered 2011-08-04: 23.9 via INTRAVENOUS

## 2011-08-04 MED ORDER — IOHEXOL 300 MG/ML  SOLN
100.0000 mL | Freq: Once | INTRAMUSCULAR | Status: AC | PRN
  Administered 2011-08-04: 100 mL via INTRAVENOUS

## 2011-12-02 ENCOUNTER — Telehealth: Payer: Self-pay

## 2011-12-02 MED ORDER — OMEPRAZOLE 20 MG PO CPDR: 20.0000 mg | DELAYED_RELEASE_CAPSULE | Freq: Every day | ORAL | Status: DC

## 2011-12-02 NOTE — Telephone Encounter (Signed)
Pt's rx for omeprazole 20mg  sent to pharmacy due to their formulary.  I updated the medicine list and deleted their rx for Lansoprazole 30mg .  Pt aware of the medicine changes approved by Dr. Leone Payor.

## 2012-11-07 ENCOUNTER — Telehealth: Payer: Self-pay | Admitting: Internal Medicine

## 2012-11-07 NOTE — Telephone Encounter (Signed)
I spoke with the patient's niece.  She is notified that he needs to see his primary care MD or an ENT.  They will call back for GI concerns

## 2013-10-25 ENCOUNTER — Ambulatory Visit (INDEPENDENT_AMBULATORY_CARE_PROVIDER_SITE_OTHER): Payer: Medicare Other | Admitting: Physician Assistant

## 2013-10-25 ENCOUNTER — Encounter: Payer: Self-pay | Admitting: Physician Assistant

## 2013-10-25 VITALS — BP 140/90 | HR 100 | Temp 98.2°F | Resp 20 | Ht 67.75 in | Wt 187.0 lb

## 2013-10-25 DIAGNOSIS — K219 Gastro-esophageal reflux disease without esophagitis: Secondary | ICD-10-CM

## 2013-10-25 DIAGNOSIS — F1020 Alcohol dependence, uncomplicated: Secondary | ICD-10-CM

## 2013-10-25 DIAGNOSIS — K449 Diaphragmatic hernia without obstruction or gangrene: Secondary | ICD-10-CM

## 2013-10-25 DIAGNOSIS — K861 Other chronic pancreatitis: Secondary | ICD-10-CM

## 2013-10-25 DIAGNOSIS — K5790 Diverticulosis of intestine, part unspecified, without perforation or abscess without bleeding: Secondary | ICD-10-CM

## 2013-10-25 DIAGNOSIS — K5730 Diverticulosis of large intestine without perforation or abscess without bleeding: Secondary | ICD-10-CM

## 2013-10-25 DIAGNOSIS — R109 Unspecified abdominal pain: Secondary | ICD-10-CM

## 2013-10-25 DIAGNOSIS — K2100 Gastro-esophageal reflux disease with esophagitis, without bleeding: Secondary | ICD-10-CM

## 2013-10-25 DIAGNOSIS — Z860100 Personal history of colon polyps, unspecified: Secondary | ICD-10-CM

## 2013-10-25 LAB — COMPLETE METABOLIC PANEL WITH GFR
ALK PHOS: 110 U/L (ref 39–117)
ALT: 21 U/L (ref 0–53)
AST: 18 U/L (ref 0–37)
Albumin: 3.6 g/dL (ref 3.5–5.2)
BILIRUBIN TOTAL: 0.4 mg/dL (ref 0.2–1.2)
BUN: 15 mg/dL (ref 6–23)
CO2: 29 mEq/L (ref 19–32)
CREATININE: 1.09 mg/dL (ref 0.50–1.35)
Calcium: 9.1 mg/dL (ref 8.4–10.5)
Chloride: 106 mEq/L (ref 96–112)
GFR, EST NON AFRICAN AMERICAN: 67 mL/min
GFR, Est African American: 78 mL/min
GLUCOSE: 126 mg/dL — AB (ref 70–99)
Potassium: 4.8 mEq/L (ref 3.5–5.3)
Sodium: 145 mEq/L (ref 135–145)
Total Protein: 6.5 g/dL (ref 6.0–8.3)

## 2013-10-25 LAB — CBC WITH DIFFERENTIAL/PLATELET
BASOS PCT: 0 % (ref 0–1)
Basophils Absolute: 0 10*3/uL (ref 0.0–0.1)
EOS ABS: 0.1 10*3/uL (ref 0.0–0.7)
EOS PCT: 2 % (ref 0–5)
HCT: 50.9 % (ref 39.0–52.0)
HEMOGLOBIN: 17 g/dL (ref 13.0–17.0)
Lymphocytes Relative: 41 % (ref 12–46)
Lymphs Abs: 3.1 10*3/uL (ref 0.7–4.0)
MCH: 29.7 pg (ref 26.0–34.0)
MCHC: 33.4 g/dL (ref 30.0–36.0)
MCV: 88.8 fL (ref 78.0–100.0)
MONO ABS: 0.7 10*3/uL (ref 0.1–1.0)
MONOS PCT: 9 % (ref 3–12)
Neutro Abs: 3.6 10*3/uL (ref 1.7–7.7)
Neutrophils Relative %: 48 % (ref 43–77)
Platelets: 221 10*3/uL (ref 150–400)
RBC: 5.73 MIL/uL (ref 4.22–5.81)
RDW: 14.6 % (ref 11.5–15.5)
WBC: 7.6 10*3/uL (ref 4.0–10.5)

## 2013-10-25 LAB — AMYLASE: Amylase: 43 U/L (ref 0–105)

## 2013-10-25 LAB — LIPASE

## 2013-10-25 MED ORDER — DICYCLOMINE HCL 20 MG PO TABS: 20.0000 mg | ORAL_TABLET | Freq: Three times a day (TID) | ORAL | Status: DC
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
## 2013-10-26 ENCOUNTER — Telehealth: Payer: Self-pay | Admitting: Internal Medicine

## 2013-10-26 NOTE — Telephone Encounter (Signed)
Left message for patient to call back  

## 2013-10-27 NOTE — Telephone Encounter (Signed)
Left message for patient to call back  

## 2013-10-30 NOTE — Telephone Encounter (Signed)
Patient is scheduled for office visit with Dr. Carlean Purl for 11/28/13

## 2013-11-30 ENCOUNTER — Ambulatory Visit: Payer: Medicare Other | Admitting: Internal Medicine

## 2015-09-02 ENCOUNTER — Ambulatory Visit (INDEPENDENT_AMBULATORY_CARE_PROVIDER_SITE_OTHER): Payer: Medicare Other | Admitting: Physician Assistant

## 2015-09-02 ENCOUNTER — Encounter: Payer: Self-pay | Admitting: Physician Assistant

## 2015-09-02 VITALS — BP 110/66 | HR 88 | Temp 98.5°F | Resp 18 | Wt 179.0 lb

## 2015-09-02 DIAGNOSIS — K219 Gastro-esophageal reflux disease without esophagitis: Secondary | ICD-10-CM

## 2015-09-02 DIAGNOSIS — Z23 Encounter for immunization: Secondary | ICD-10-CM | POA: Diagnosis not present

## 2015-09-02 DIAGNOSIS — K861 Other chronic pancreatitis: Secondary | ICD-10-CM | POA: Diagnosis not present

## 2015-09-02 DIAGNOSIS — M479 Spondylosis, unspecified: Secondary | ICD-10-CM | POA: Diagnosis not present

## 2015-09-02 MED ORDER — CELECOXIB 200 MG PO CAPS: 200.0000 mg | ORAL_CAPSULE | Freq: Every day | ORAL | Status: DC
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
## 2015-09-04 ENCOUNTER — Encounter: Payer: Self-pay | Admitting: Physician Assistant

## 2015-09-23 ENCOUNTER — Ambulatory Visit: Payer: Medicare Other | Admitting: Physician Assistant

## 2015-09-26 ENCOUNTER — Other Ambulatory Visit (INDEPENDENT_AMBULATORY_CARE_PROVIDER_SITE_OTHER): Payer: Medicare Other

## 2015-09-26 ENCOUNTER — Telehealth: Payer: Self-pay | Admitting: *Deleted

## 2015-09-26 ENCOUNTER — Encounter: Payer: Self-pay | Admitting: Physician Assistant

## 2015-09-26 ENCOUNTER — Ambulatory Visit (INDEPENDENT_AMBULATORY_CARE_PROVIDER_SITE_OTHER): Payer: Medicare Other | Admitting: Physician Assistant

## 2015-09-26 VITALS — BP 120/84 | HR 80 | Ht 67.75 in | Wt 179.0 lb

## 2015-09-26 DIAGNOSIS — K861 Other chronic pancreatitis: Secondary | ICD-10-CM

## 2015-09-26 DIAGNOSIS — R197 Diarrhea, unspecified: Secondary | ICD-10-CM | POA: Diagnosis not present

## 2015-09-26 DIAGNOSIS — K219 Gastro-esophageal reflux disease without esophagitis: Secondary | ICD-10-CM

## 2015-09-26 DIAGNOSIS — Z86010 Personal history of colonic polyps: Secondary | ICD-10-CM

## 2015-09-26 LAB — COMPREHENSIVE METABOLIC PANEL
ALK PHOS: 119 U/L — AB (ref 39–117)
ALT: 14 U/L (ref 0–53)
AST: 15 U/L (ref 0–37)
Albumin: 3.7 g/dL (ref 3.5–5.2)
BILIRUBIN TOTAL: 0.7 mg/dL (ref 0.2–1.2)
BUN: 15 mg/dL (ref 6–23)
CALCIUM: 9.4 mg/dL (ref 8.4–10.5)
CO2: 30 mEq/L (ref 19–32)
Chloride: 105 mEq/L (ref 96–112)
Creatinine, Ser: 1.07 mg/dL (ref 0.40–1.50)
GFR: 71.63 mL/min (ref >60.00)
Glucose, Bld: 154 mg/dL — ABNORMAL HIGH (ref 70–99)
Potassium: 4.6 mEq/L (ref 3.5–5.1)
Sodium: 143 mEq/L (ref 135–145)
TOTAL PROTEIN: 6.8 g/dL (ref 6.0–8.3)

## 2015-09-26 LAB — CBC WITH DIFFERENTIAL/PLATELET
BASOS ABS: 0 10*3/uL (ref 0.0–0.1)
Basophils Relative: 0.6 % (ref 0.0–3.0)
EOS PCT: 1.5 % (ref 0.0–5.0)
Eosinophils Absolute: 0.1 10*3/uL (ref 0.0–0.7)
HEMATOCRIT: 50.5 % (ref 39.0–52.0)
Hemoglobin: 16.4 g/dL (ref 13.0–17.0)
LYMPHS PCT: 35 % (ref 12.0–46.0)
Lymphs Abs: 2.7 10*3/uL (ref 0.7–4.0)
MCHC: 32.5 g/dL (ref 30.0–36.0)
MCV: 89.4 fl (ref 78.0–100.0)
MONOS PCT: 6.9 % (ref 3.0–12.0)
Monocytes Absolute: 0.5 10*3/uL (ref 0.1–1.0)
NEUTROS ABS: 4.3 10*3/uL (ref 1.4–7.7)
Neutrophils Relative %: 56 % (ref 43.0–77.0)
Platelets: 242 10*3/uL (ref 150.0–400.0)
RBC: 5.65 Mil/uL (ref 4.22–5.81)
RDW: 13.7 % (ref 11.5–15.5)
WBC: 7.7 10*3/uL (ref 4.0–10.5)

## 2015-09-26 LAB — LIPASE: Lipase: 11 U/L (ref 11.0–59.0)

## 2015-09-26 LAB — AMYLASE: AMYLASE: 42 U/L (ref 27–131)

## 2015-09-26 MED ORDER — LANSOPRAZOLE 30 MG PO CPDR: 30.0000 mg | DELAYED_RELEASE_CAPSULE | ORAL | Status: DC

## 2015-09-26 MED ORDER — HYOSCYAMINE SULFATE ER 0.375 MG PO TB12: 0.3750 mg | ORAL_TABLET | Freq: Two times a day (BID) | ORAL | Status: DC

## 2015-09-26 MED ORDER — POLYETHYLENE GLYCOL 3350 17 GM/SCOOP PO POWD: 1.0000 | Freq: Every day | ORAL | Status: DC
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
## 2015-09-26 NOTE — Telephone Encounter (Signed)
Patient cannot afford the medications he was given today. States the three rx's were $70.

## 2015-09-26 NOTE — Telephone Encounter (Signed)
Can he instead try omeprazole 40 mg po q am, and bentyl 10 mg tid prn?

## 2015-09-26 NOTE — Patient Instructions (Signed)
You have been scheduled for a CT scan of the abdomen and pelvis at [facility] ([address]---this is in the same building as Press [employer]).   You are scheduled on Tuesday 10/01/15 at 2 pm. You should arrive 15 minutes prior to your appointment time for registration. Please follow the written instructions below on the day of your exam:  WARNING: IF YOU ARE ALLERGIC TO IODINE/X-RAY DYE, PLEASE NOTIFY RADIOLOGY IMMEDIATELY AT [phone]! YOU WILL BE GIVEN A 13 HOUR PREMEDICATION PREP.  1) Do not eat or drink anything after 10 am (4 hours prior to your test) 2) You have been given 2 bottles of oral contrast to drink. The solution may taste better if refrigerated, but do NOT add ice or any other liquid to this solution. Shake well before drinking.    Drink 1 bottle of contrast @ 12 pm (2 hours prior to your exam)  Drink 1 bottle of contrast @ 1 pm (1 hour prior to your exam)  You may take any medications as prescribed with a small amount of water except for the following: Metformin, Glucophage, Glucovance, Avandamet, Riomet, Fortamet, Actoplus Met, Janumet, Glumetza or Metaglip. The above medications must be held the day of the exam AND 48 hours after the exam.  The purpose of you drinking the oral contrast is to aid in the visualization of your intestinal tract. The contrast solution may cause some diarrhea. Before your exam is started, you will be given a small amount of fluid to drink. Depending on your individual set of symptoms, you may also receive an intravenous injection of x-ray contrast/dye. Plan on being at [facility] for 30 minutes or longer, depending on the type of exam you are having performed.  This test typically takes 30-45 minutes to complete.  If you have any questions regarding your exam or if you need to reschedule, you may call the CT department at [phone] between the hours of 8:00 am and 5:00 pm,  Monday-Friday.  ________________________________________________________________________   Gary Sweeney have been scheduled for an endoscopy and colonoscopy. Please follow the written instructions given to you at your visit today. Please pick up your prep supplies at the pharmacy within the next 1-3 days. If you use inhalers (even only as needed), please bring them with you on the day of your procedure. Your physician has requested that you go to www.startemmi.com and enter the access code given to you at your visit today. This web site gives a general overview about your procedure. However, you should still follow specific instructions given to you by our office regarding your preparation for the procedure.  Your physician has requested that you go to the basement for lab work before leaving today.  We have sent the following medications to your pharmacy for you to pick up at your convenience: Prevacid 30 mg please take this 30 mins prior to breakfast Levbid 0.375 mg twice a day  We have given you a handout on antireflux regimen.

## 2015-09-27 ENCOUNTER — Encounter: Payer: Self-pay | Admitting: *Deleted

## 2015-09-27 MED ORDER — DICYCLOMINE HCL 10 MG PO CAPS: ORAL_CAPSULE | ORAL | Status: DC

## 2015-09-27 MED ORDER — OMEPRAZOLE 40 MG PO CPDR: 40.0000 mg | DELAYED_RELEASE_CAPSULE | Freq: Every day | ORAL | Status: DC

## 2015-09-27 NOTE — Telephone Encounter (Signed)
New rx's sent. Patient aware.

## 2015-09-30 ENCOUNTER — Telehealth: Payer: Self-pay | Admitting: Physician Assistant

## 2015-09-30 NOTE — Telephone Encounter (Signed)
Spoke with patient's caregiver and place a bottle of contrast up front for pick up. (he lost one bottle)

## 2015-09-30 NOTE — Progress Notes (Signed)
Agree w/ Ms. Hvozdovic's note and mangement.  

## 2015-10-01 ENCOUNTER — Ambulatory Visit (INDEPENDENT_AMBULATORY_CARE_PROVIDER_SITE_OTHER)
Admission: RE | Admit: 2015-10-01 | Discharge: 2015-10-01 | Disposition: A | Discharge status: Home / Self Care | Payer: Medicare Other | Source: Ambulatory Visit | Attending: Physician Assistant | Admitting: Physician Assistant

## 2015-10-01 DIAGNOSIS — K861 Other chronic pancreatitis: Secondary | ICD-10-CM | POA: Diagnosis not present

## 2015-10-01 MED ORDER — IOHEXOL 300 MG/ML  SOLN
100.0000 mL | Freq: Once | INTRAMUSCULAR | Status: AC | PRN
  Administered 2015-10-01: 100 mL via INTRAVENOUS

## 2015-10-29 ENCOUNTER — Ambulatory Visit (AMBULATORY_SURGERY_CENTER): Payer: Medicare Other | Admitting: Internal Medicine

## 2015-10-29 ENCOUNTER — Encounter: Payer: Self-pay | Admitting: Internal Medicine

## 2015-10-29 VITALS — BP 141/84 | HR 79 | Temp 98.1°F | Resp 19 | Ht 67.75 in | Wt 179.0 lb

## 2015-10-29 DIAGNOSIS — K5733 Diverticulitis of large intestine without perforation or abscess with bleeding: Secondary | ICD-10-CM

## 2015-10-29 DIAGNOSIS — D129 Benign neoplasm of anus and anal canal: Secondary | ICD-10-CM

## 2015-10-29 DIAGNOSIS — R1013 Epigastric pain: Secondary | ICD-10-CM

## 2015-10-29 DIAGNOSIS — K5732 Diverticulitis of large intestine without perforation or abscess without bleeding: Secondary | ICD-10-CM

## 2015-10-29 DIAGNOSIS — D128 Benign neoplasm of rectum: Secondary | ICD-10-CM | POA: Diagnosis not present

## 2015-10-29 DIAGNOSIS — K529 Noninfective gastroenteritis and colitis, unspecified: Secondary | ICD-10-CM

## 2015-10-29 DIAGNOSIS — Z86010 Personal history of colonic polyps: Secondary | ICD-10-CM | POA: Diagnosis not present

## 2015-10-29 DIAGNOSIS — D124 Benign neoplasm of descending colon: Secondary | ICD-10-CM | POA: Diagnosis not present

## 2015-10-29 MED ORDER — SODIUM CHLORIDE 0.9 % IV SOLN: 500.0000 mL | INTRAVENOUS | Status: DC

## 2015-10-29 MED ORDER — AMOXICILLIN-POT CLAVULANATE 875-125 MG PO TABS: 1.0000 | ORAL_TABLET | Freq: Two times a day (BID) | ORAL | Status: DC

## 2015-10-29 NOTE — Progress Notes (Signed)
Patient awakening,vss,report to rn 

## 2015-10-29 NOTE — Op Note (Signed)
[place]  [address] [place], [address]   ENDOSCOPY PROCEDURE REPORT  PATIENT: Gary Sweeney, Gary Sweeney  MR#: [id number] BIRTHDATE: 06-07-1941 , 74  yrs. old GENDER: male ENDOSCOPIST: Gatha Mayer, MD, [facility] PROCEDURE DATE:  10/29/2015 PROCEDURE:  EGD, diagnostic ASA CLASS:     Class III INDICATIONS:  epigastric pain. MEDICATIONS: Propofol 100 mg IV and Monitored anesthesia care TOPICAL ANESTHETIC: none  DESCRIPTION OF PROCEDURE: After the risks benefits and alternatives of the procedure were thoroughly explained, informed consent was obtained.  The LB [id number] endoscope was introduced through the mouth and advanced to the second portion of the duodenum , Without limitations.  The instrument was slowly withdrawn as the mucosa was fully examined.    2-3 cm hiatal hernia otherwise normal EGD.  Retroflexed views revealed a hiatal hernia.     The scope was then withdrawn from the patient and the procedure completed.  COMPLICATIONS: There were no immediate complications.  ENDOSCOPIC IMPRESSION: 2-3 cm hiatal hernia otherwise normal EGD   - note he has had chronic abdominal pain- some ? chronic pancreatitis but I suspect it is neuropathic more likely  RECOMMENDATIONS: Proceed with a Colonoscopy.    eSigned:  Gatha Mayer, MD, [facility] 10/29/2015 3:17 PM    CC:The Patient

## 2015-10-29 NOTE — Patient Instructions (Addendum)
I found and removed 2 polyps and saw some inflammation in the colon that looks like diverticulitis. I am prescribing an antibiotic to help that.  Stomach looked ok.  I will let you know pathology results and when to have another routine colonoscopy by mail.  I appreciate the opportunity to care for you. Gatha Mayer, MD, FACG  YOU HAD AN ENDOSCOPIC PROCEDURE TODAY AT [facility]:   Refer to the procedure report that was given to you for any specific questions about what was found during the examination.  If the procedure report does not answer your questions, please call your gastroenterologist to clarify.  If you requested that your care partner not be given the details of your procedure findings, then the procedure report has been included in a sealed envelope for you to review at your convenience later.  YOU SHOULD EXPECT: Some feelings of bloating in the abdomen. Passage of more gas than usual.  Walking can help get rid of the air that was put into your GI tract during the procedure and reduce the bloating. If you had a lower endoscopy (such as a colonoscopy or flexible sigmoidoscopy) you may notice spotting of blood in your stool or on the toilet paper. If you underwent a bowel prep for your procedure, you may not have a normal bowel movement for a few days.  Please Note:  You might notice some irritation and congestion in your nose or some drainage.  This is from the oxygen used during your procedure.  There is no need for concern and it should clear up in a day or so.  SYMPTOMS TO REPORT IMMEDIATELY:   Following lower endoscopy (colonoscopy or flexible sigmoidoscopy):  Excessive amounts of blood in the stool  Significant tenderness or worsening of abdominal pains  Swelling of the abdomen that is new, acute  Fever of 100F or higher   Following upper endoscopy (EGD)  Vomiting of blood or coffee ground material  New chest pain or pain under the shoulder  blades  Painful or persistently difficult swallowing  New shortness of breath  Fever of 100F or higher  Black, tarry-looking stools  For urgent or emergent issues, a gastroenterologist can be reached at any hour by calling [phone].   DIET: Your first meal following the procedure should be a small meal and then it is ok to progress to your normal diet. Heavy or fried foods are harder to digest and may make you feel nauseous or bloated.  Likewise, meals heavy in dairy and vegetables can increase bloating.  Drink plenty of fluids but you should avoid alcoholic beverages for 24 hours.  ACTIVITY:  You should plan to take it easy for the rest of today and you should NOT DRIVE or use heavy machinery until tomorrow (because of the sedation medicines used during the test).    FOLLOW UP: Our staff will call the number listed on your records the next business day following your procedure to check on you and address any questions or concerns that you may have regarding the information given to you following your procedure. If we do not reach you, we will leave a message.  However, if you are feeling well and you are not experiencing any problems, there is no need to return our call.  We will assume that you have returned to your regular daily activities without incident.  If any biopsies were taken you will be contacted by phone or by letter within the  next 1-3 weeks.  Please call us at [phone] if you have not heard about the biopsies in 3 weeks.    SIGNATURES/CONFIDENTIALITY: You and/or your care partner have signed paperwork which will be entered into your electronic medical record.  These signatures attest to the fact that that the information above on your After Visit Summary has been reviewed and is understood.  Full responsibility of the confidentiality of this discharge information lies with you and/or your care-partner.  Hiatal hernia information given.  Polyp, diverticulosis  information given.  Diverticulitis , prescription ordered.  Avoid aspirin and NSAIDS for 2 weeks.

## 2015-10-29 NOTE — Op Note (Addendum)
[place]  [address] [place], [address]   COLONOSCOPY PROCEDURE REPORT  PATIENT: Gary Sweeney, [name]  MR#: [id number] BIRTHDATE: 02/28/1941 , 74  yrs. old GENDER: male ENDOSCOPIST: Gatha Mayer, MD, [facility] PROCEDURE DATE:  10/29/2015 PROCEDURE:   Colonoscopy, surveillance , Colonoscopy with biopsy, and Colonoscopy with snare polypectomy First Screening Colonoscopy - Avg.  risk and is 50 yrs.  old or older - No.  Prior Negative Screening - Now for repeat screening. N/A  History of Adenoma - Now for follow-up colonoscopy & has been > or = to 3 yrs.  Yes hx of adenoma.  Has been 3 or more years since last colonoscopy.  Polyps removed today? Yes ASA CLASS:   Class III INDICATIONS:Surveillance due to prior colonic neoplasia, PH Colon Adenoma, and FH Colon or Rectal Adenocarcinoma. MEDICATIONS: Propofol 100 mg IV  DESCRIPTION OF PROCEDURE:   After the risks benefits and alternatives of the procedure were thoroughly explained, informed consent was obtained.  The digital rectal exam revealed a small polypoid lesion.        The LB [id number]  endoscope was introduced through the anus and advanced to the cecum, which was identified by both the appendix and ileocecal valve. No adverse events experienced.   The quality of the prep was good.  (MiraLax was used)  The instrument was then slowly withdrawn as the colon was fully examined. Estimated blood loss is zero unless otherwise noted in this procedure report.   COLON FINDINGS: Two polyps ranging from 7 to 4m in size were found in the rectum and descending colon.  Polypectomies were performed using snare cautery.  The resection was complete, the polyp tissue was completely retrieved and sent to histology.   There was diverticulosis noted in the sigmoid colon.   Focal inflammatory changes suggestive of diverticulitis in mid sigmoid - biopsies taken.   The examination was otherwise normal.   Retyroflexion revealed the distal rectal polyp. The time to cecum = 2.7 Withdrawal time = 11.3   The scope was withdrawn and the procedure completed. COMPLICATIONS: There were no immediate complications.  ENDOSCOPIC IMPRESSION: 1.   Two polyps ranging from 7 to 953min size were found in the rectum and descending colon; polypectomies were performed using snare cautery 2.   Diverticulosis was noted in the sigmoid colon 3.   Focal inflammatory changes suggestive of diverticulitis in mid sigmoid - biopsies taken 4.   The examination was otherwise normal  RECOMMENDATIONS: 1.  Timing of repeat colonoscopy will be determined by pathology findings. 2.  Amoxicillin clavulanate 875 mg bid x 10 days for diverticulitis  3.  Avoid aspirin/NSAID x 2 weeks  eSigned:  CaGatha MayerMD, FAWallowa Memorial Hospital2/14/2017 3:26 PM Revised: 10/29/2015 3:26 PM cc: The Patient

## 2015-10-29 NOTE — Progress Notes (Signed)
Called to room to assist during endoscopic procedure.  Patient ID and intended procedure confirmed with present staff. Received instructions for my participation in the procedure from the performing physician.  

## 2015-10-29 NOTE — Progress Notes (Addendum)
Pt is poor historian, not sure what medications he takes, pt states he takes several sleeping medications but does not know the names of them-adm

## 2015-10-30 ENCOUNTER — Telehealth: Payer: Self-pay

## 2015-10-30 NOTE — Telephone Encounter (Signed)
  Follow up Call-  Call back number 10/29/2015  Post procedure Call Back phone  # [phone]  Permission to leave phone message No    Patient was called for follow up after his procedure on 10/29/2015. Bethena Roys is the patients care taker and she reports that Mr. Gary Sweeney has returned to his normal daily activities without any difficulties.

## 2015-11-06 ENCOUNTER — Encounter: Payer: Self-pay | Admitting: Internal Medicine

## 2015-11-06 DIAGNOSIS — Z86010 Personal history of colonic polyps: Secondary | ICD-10-CM

## 2015-11-06 NOTE — Progress Notes (Signed)
Quick Note:  2 adenomas max 9 mm No recall (age) Also has mild colitis - I think was diverticulitis ______

## 2015-12-05 ENCOUNTER — Other Ambulatory Visit: Payer: Self-pay | Admitting: Physician Assistant

## 2015-12-06 NOTE — Telephone Encounter (Signed)
Refill appropriate and filled per protocol. 

## 2015-12-18 ENCOUNTER — Encounter: Payer: Self-pay | Admitting: Internal Medicine

## 2016-01-17 ENCOUNTER — Ambulatory Visit: Payer: Self-pay | Admitting: *Deleted

## 2016-01-17 ENCOUNTER — Other Ambulatory Visit: Payer: Self-pay | Admitting: *Deleted

## 2016-01-17 NOTE — Patient Outreach (Signed)
[facility]) Care Management  01/17/2016  Gary Sweeney Jun 02, 1941 [id number]   RN Health Coach  Attempted 1st screening  outreach call to patient.  Patient was unavailable. Hip[employer] compliance voice mail left.  [place] Care Management [phone]

## 2016-01-22 ENCOUNTER — Ambulatory Visit: Payer: Self-pay | Admitting: *Deleted

## 2016-01-22 ENCOUNTER — Other Ambulatory Visit: Payer: Self-pay | Admitting: *Deleted

## 2016-01-22 NOTE — Patient Outreach (Signed)
[facility]) Care Management  01/22/2016  DARA CAMARGO 02/26/1941 [id number]   RN Health Coach  Attempted screening  outreach call to patient.  Patient was unavailable.  Per patient  Family member who was in a car stated he is not with them now and to call back in a couple of hours. [facility] Care Management [phone]

## 2016-01-29 ENCOUNTER — Encounter: Payer: Self-pay | Admitting: Family Medicine

## 2016-01-29 ENCOUNTER — Ambulatory Visit
Admission: RE | Admit: 2016-01-29 | Discharge: 2016-01-29 | Disposition: A | Discharge status: Home / Self Care | Payer: Medicare Other | Source: Ambulatory Visit | Attending: Family Medicine | Admitting: Family Medicine

## 2016-01-29 ENCOUNTER — Ambulatory Visit (INDEPENDENT_AMBULATORY_CARE_PROVIDER_SITE_OTHER): Payer: Medicare Other | Admitting: Family Medicine

## 2016-01-29 VITALS — BP 148/82 | HR 100 | Temp 98.9°F | Resp 20 | Ht 68.0 in | Wt 174.0 lb

## 2016-01-29 DIAGNOSIS — R05 Cough: Secondary | ICD-10-CM

## 2016-01-29 DIAGNOSIS — J209 Acute bronchitis, unspecified: Secondary | ICD-10-CM | POA: Diagnosis not present

## 2016-01-29 DIAGNOSIS — R059 Cough, unspecified: Secondary | ICD-10-CM

## 2016-01-29 MED ORDER — BENZONATATE 100 MG PO CAPS: 100.0000 mg | ORAL_CAPSULE | Freq: Two times a day (BID) | ORAL | Status: DC | PRN

## 2016-01-29 MED ORDER — AZITHROMYCIN 250 MG PO TABS: ORAL_TABLET | ORAL | Status: DC

## 2016-01-29 NOTE — Patient Instructions (Signed)
Take antibiotics Get cough medicine Chest xray- [facility])  F/U as needed

## 2016-01-29 NOTE — Progress Notes (Signed)
Patient ID: Gary Sweeney, male   DOB: 08-20-1941, 75 y.o.   MRN: [id number]    Subjective:    Patient ID: Gary Sweeney, male    DOB: 03/29/1941, 75 y.o.   MRN: [id number]  Patient presents for Illness  Patient here with his sister. He has had cough with production of yellow sputum for the past couple months. He states his been a long time but he cannot give any specifics. He has not had any fever. Occasionally he gets some shortness of breath and wheezing. He has not tried anything over-the-counter. He actually only came in because he wanted a prescription to go get an x-ray. Evidently his urologist where a prescription for him to have a chest x-ray but he lost this. Denies any chest pain. His blood pressures a little elevated today    Review Of Systems:  GEN- denies fatigue, fever, weight loss,weakness, recent illness HEENT- denies eye drainage, change in vision, nasal discharge, CVS- denies chest pain, palpitations RESP- denies SOB, cough, wheeze ABD- denies N/V, change in stools, abd pain GU- denies dysuria, hematuria, dribbling, incontinence MSK- denies joint pain, muscle aches, injury Neuro- denies headache, dizziness, syncope, seizure activity       Objective:    BP 148/82 mmHg  Pulse 100  Temp(Src) 98.9 F (37.2 C) (Oral)  Resp 20  Ht '5\' 8"'$  (1.727 m)  Wt 174 lb (78.926 kg)  BMI 26.46 kg/m2  SpO2 96% GEN- NAD, alert and oriented x3 HEENT- PERRL, EOMI, non injected sclera, pink conjunctiva, MMM, oropharynx clear, nares clear rhinorrhea, no maxillary sinus tenderness  Neck- Supple, no thyromegaly CVS- RRR, no murmur RESP-clear , no wheeze, no rales, normal WOB EXT- No edema Pulses- Radial, DP- 2+        Assessment & Plan:      Problem List Items Addressed This Visit    None    Visit Diagnoses    Cough    -  Primary    Relevant Orders    DG Chest 2 View    Acute bronchitis, unspecified organism        CXR to be done, quit tobacco 2 years ago,  given tessalon, zpak. He has smal non specific lung nodules back in 2012. BP has been controlled without any meds       Note: This dictation was prepared with Dragon dictation along with smaller phrase technology. Any transcriptional errors that result from this process are unintentional.

## 2016-01-30 ENCOUNTER — Other Ambulatory Visit: Payer: Self-pay | Admitting: *Deleted

## 2016-01-30 DIAGNOSIS — R911 Solitary pulmonary nodule: Secondary | ICD-10-CM

## 2016-01-31 ENCOUNTER — Other Ambulatory Visit: Payer: Self-pay | Admitting: *Deleted

## 2016-01-31 NOTE — Patient Outreach (Signed)
[facility]) Care Management  01/31/2016  Gary Sweeney 1941/07/04 [id number]   RN Health Coach  Attempted  3rd screening  outreach call to patient.  Patient was unavailable. [employer] compliance message left on voicemail.  [place] Care Management [phone]

## 2016-02-13 ENCOUNTER — Encounter (HOSPITAL_COMMUNITY): Payer: Self-pay

## 2016-02-13 ENCOUNTER — Ambulatory Visit (HOSPITAL_COMMUNITY)
Admission: RE | Admit: 2016-02-13 | Discharge: 2016-02-13 | Disposition: A | Discharge status: Home / Self Care | Payer: Medicare Other | Source: Ambulatory Visit | Attending: Family Medicine | Admitting: Family Medicine

## 2016-02-13 DIAGNOSIS — R911 Solitary pulmonary nodule: Secondary | ICD-10-CM | POA: Insufficient documentation

## 2016-02-13 DIAGNOSIS — R918 Other nonspecific abnormal finding of lung field: Secondary | ICD-10-CM | POA: Insufficient documentation

## 2016-02-14 ENCOUNTER — Other Ambulatory Visit: Payer: Self-pay | Admitting: Family Medicine

## 2016-02-14 DIAGNOSIS — R911 Solitary pulmonary nodule: Secondary | ICD-10-CM

## 2016-02-17 ENCOUNTER — Ambulatory Visit (INDEPENDENT_AMBULATORY_CARE_PROVIDER_SITE_OTHER): Payer: Medicare Other | Admitting: Family Medicine

## 2016-02-17 ENCOUNTER — Encounter: Payer: Self-pay | Admitting: Family Medicine

## 2016-02-17 VITALS — BP 128/74 | HR 68 | Temp 97.8°F | Resp 14 | Ht 67.0 in | Wt 174.0 lb

## 2016-02-17 DIAGNOSIS — M25562 Pain in left knee: Secondary | ICD-10-CM

## 2016-02-17 DIAGNOSIS — R918 Other nonspecific abnormal finding of lung field: Secondary | ICD-10-CM | POA: Diagnosis not present

## 2016-02-17 DIAGNOSIS — W19XXXA Unspecified fall, initial encounter: Secondary | ICD-10-CM | POA: Diagnosis not present

## 2016-02-17 NOTE — Patient Instructions (Signed)
Biopsy to be set up F/U pending results

## 2016-02-17 NOTE — Progress Notes (Signed)
Patient ID: Gary Sweeney, male   DOB: [birth year], 75 y.o.   MRN: [id number]    Subjective:    Patient ID: Gary Sweeney, male    DOB: 01-Sep-1941, 75 y.o.   MRN: [id number]  Patient presents for Discuss CT Results  Patient here to discuss CT scan results. He was seen secondary to cough and he has a long term smoker. Chest x-ray was abnormal therefore prompting CT of chest. CT of chest showed spiculated left upper lobe nodule as well as poorly defined left hilar mass and multiple enlarged lymph nodes. He is here today with his brother and his sister. There is no family history of any lung cancer there is a strong family history of prostate cancer which he has had has been treated for.  He also several fall last week trying to get out of the wheelchair to hospital. He had some bruising on his left knee into scabs. He still has some mild swelling. He did not take anything for pain. He does have Celebrex. Review Of Systems:  GEN- denies fatigue, fever, weight loss,weakness, recent illness HEENT- denies eye drainage, change in vision, nasal discharge, CVS- denies chest pain, palpitations RESP- denies SOB, cough, wheeze ABD- denies N/V, change in stools, abd pain GU- denies dysuria, hematuria, dribbling, incontinence MSK- + joint pain, muscle aches, injury Neuro- denies headache, dizziness, syncope, seizure activity       Objective:    BP 128/74 mmHg  Pulse 68  Temp(Src) 97.8 F (36.6 C) (Oral)  Resp 14  Ht '5\' 7"'$  (1.702 m)  Wt 174 lb (78.926 kg)  BMI 27.25 kg/m2 GEN- NAD, alert and oriented x3 HEENT- PERRL, EOMI, non injected sclera, pink conjunctiva, MMM, oropharynx clear Neck- Supple, no LAD  CVS- RRR, no murmur RESP-CTAB MSK- Bilat knee- decreased ROM, mild swelling over patella and below on left knee, 2 abrasions with scabs, mild swelling, mild TTP no warmth , ambulating at baseline        Assessment & Plan:      Problem List Items Addressed This Visit    None     Visit Diagnoses    Lung mass    -  Primary    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 Prefer to obtain more information then decide if his quality of Life would benefit from cancer treatment     Knee pain, acute, left        Injury to knee s/p fall, swelling much improved compared to pt description, ICE , elevate, tylenol ES BID given from office #20 tabs, continue celebrex, hold onImaging at this time he is able to ambulate     Accidental fall                Note: This dictation was prepared with Dragon dictation along with smaller phrase technology. Any transcriptional errors that result from this process are unintentional.

## 2016-02-18 ENCOUNTER — Other Ambulatory Visit: Payer: Medicare Other

## 2016-02-25 ENCOUNTER — Encounter: Payer: Medicare Other | Admitting: Thoracic Surgery (Cardiothoracic Vascular Surgery)

## 2016-02-25 ENCOUNTER — Other Ambulatory Visit: Payer: Self-pay | Admitting: *Deleted

## 2016-02-25 ENCOUNTER — Institutional Professional Consult (permissible substitution) (INDEPENDENT_AMBULATORY_CARE_PROVIDER_SITE_OTHER): Payer: Medicare Other | Admitting: Thoracic Surgery (Cardiothoracic Vascular Surgery)

## 2016-02-25 ENCOUNTER — Encounter: Payer: Self-pay | Admitting: Thoracic Surgery (Cardiothoracic Vascular Surgery)

## 2016-02-25 VITALS — BP 127/71 | HR 89 | Resp 20 | Ht 67.0 in | Wt 175.0 lb

## 2016-02-25 DIAGNOSIS — R918 Other nonspecific abnormal finding of lung field: Secondary | ICD-10-CM

## 2016-02-25 DIAGNOSIS — R599 Enlarged lymph nodes, unspecified: Secondary | ICD-10-CM

## 2016-02-25 DIAGNOSIS — R591 Generalized enlarged lymph nodes: Secondary | ICD-10-CM

## 2016-02-25 DIAGNOSIS — R911 Solitary pulmonary nodule: Secondary | ICD-10-CM
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
## 2016-02-26 ENCOUNTER — Other Ambulatory Visit: Payer: Self-pay | Admitting: *Deleted

## 2016-02-26 DIAGNOSIS — R918 Other nonspecific abnormal finding of lung field: Secondary | ICD-10-CM

## 2016-03-03 ENCOUNTER — Other Ambulatory Visit: Payer: Medicare Other

## 2016-03-04 ENCOUNTER — Ambulatory Visit (HOSPITAL_COMMUNITY)
Admission: RE | Admit: 2016-03-04 | Discharge: 2016-03-04 | Disposition: A | Discharge status: Home / Self Care | Payer: Medicare Other | Source: Ambulatory Visit | Attending: Thoracic Surgery (Cardiothoracic Vascular Surgery) | Admitting: Thoracic Surgery (Cardiothoracic Vascular Surgery)

## 2016-03-04 DIAGNOSIS — K219 Gastro-esophageal reflux disease without esophagitis: Secondary | ICD-10-CM | POA: Insufficient documentation

## 2016-03-04 DIAGNOSIS — R109 Unspecified abdominal pain: Secondary | ICD-10-CM | POA: Insufficient documentation

## 2016-03-04 DIAGNOSIS — Z8546 Personal history of malignant neoplasm of prostate: Secondary | ICD-10-CM | POA: Diagnosis not present

## 2016-03-04 DIAGNOSIS — R918 Other nonspecific abnormal finding of lung field: Secondary | ICD-10-CM | POA: Diagnosis present

## 2016-03-04 DIAGNOSIS — Z87891 Personal history of nicotine dependence: Secondary | ICD-10-CM | POA: Diagnosis not present

## 2016-03-04 DIAGNOSIS — K769 Liver disease, unspecified: Secondary | ICD-10-CM | POA: Diagnosis not present

## 2016-03-04 DIAGNOSIS — Z01818 Encounter for other preprocedural examination: Secondary | ICD-10-CM | POA: Diagnosis not present

## 2016-03-04 DIAGNOSIS — Z923 Personal history of irradiation: Secondary | ICD-10-CM | POA: Insufficient documentation

## 2016-03-04 DIAGNOSIS — R591 Generalized enlarged lymph nodes: Secondary | ICD-10-CM | POA: Insufficient documentation

## 2016-03-04 DIAGNOSIS — Z01812 Encounter for preprocedural laboratory examination: Secondary | ICD-10-CM | POA: Insufficient documentation

## 2016-03-04 DIAGNOSIS — G8929 Other chronic pain: Secondary | ICD-10-CM | POA: Diagnosis not present

## 2016-03-04 DIAGNOSIS — Z79899 Other long term (current) drug therapy: Secondary | ICD-10-CM | POA: Diagnosis not present

## 2016-03-04 DIAGNOSIS — F1021 Alcohol dependence, in remission: Secondary | ICD-10-CM | POA: Insufficient documentation

## 2016-03-04 LAB — GLUCOSE, CAPILLARY: GLUCOSE-CAPILLARY: 132 mg/dL — AB (ref 65–99)

## 2016-03-04 MED ORDER — FLUDEOXYGLUCOSE F - 18 (FDG) INJECTION
8.6900 | Freq: Once | INTRAVENOUS | Status: AC | PRN
  Administered 2016-03-04: 8.69 via INTRAVENOUS

## 2016-03-05 ENCOUNTER — Encounter (HOSPITAL_COMMUNITY)
Admission: RE | Admit: 2016-03-05 | Discharge: 2016-03-05 | Disposition: A | Discharge status: Home / Self Care | Payer: Medicare Other | Source: Ambulatory Visit | Attending: Thoracic Surgery (Cardiothoracic Vascular Surgery) | Admitting: Thoracic Surgery (Cardiothoracic Vascular Surgery)

## 2016-03-05 ENCOUNTER — Other Ambulatory Visit: Payer: Self-pay

## 2016-03-05 ENCOUNTER — Other Ambulatory Visit (HOSPITAL_COMMUNITY): Payer: Self-pay | Admitting: *Deleted

## 2016-03-05 ENCOUNTER — Encounter (HOSPITAL_COMMUNITY): Payer: Self-pay

## 2016-03-05 VITALS — BP 120/70 | HR 97 | Temp 97.4°F | Resp 18 | Ht 70.0 in | Wt 171.5 lb

## 2016-03-05 DIAGNOSIS — Z01818 Encounter for other preprocedural examination: Secondary | ICD-10-CM | POA: Diagnosis not present

## 2016-03-05 DIAGNOSIS — R599 Enlarged lymph nodes, unspecified: Secondary | ICD-10-CM

## 2016-03-05 DIAGNOSIS — Z01812 Encounter for preprocedural laboratory examination: Secondary | ICD-10-CM | POA: Diagnosis not present

## 2016-03-05 DIAGNOSIS — R911 Solitary pulmonary nodule: Secondary | ICD-10-CM

## 2016-03-05 DIAGNOSIS — R591 Generalized enlarged lymph nodes: Secondary | ICD-10-CM

## 2016-03-05 DIAGNOSIS — R918 Other nonspecific abnormal finding of lung field: Secondary | ICD-10-CM | POA: Diagnosis not present

## 2016-03-05 HISTORY — DX: Reserved for inherently not codable concepts without codable children: IMO0001

## 2016-03-05 LAB — COMPREHENSIVE METABOLIC PANEL
ALBUMIN: 3.2 g/dL — AB (ref 3.5–5.0)
ALK PHOS: 106 U/L (ref 38–126)
ALT: 14 U/L — ABNORMAL LOW (ref 17–63)
ANION GAP: 5 (ref 5–15)
AST: 16 U/L (ref 15–41)
BILIRUBIN TOTAL: 0.6 mg/dL (ref 0.3–1.2)
BUN: 13 mg/dL (ref 6–20)
CALCIUM: 9 mg/dL (ref 8.9–10.3)
CO2: 28 mmol/L (ref 22–32)
Chloride: 106 mmol/L (ref 101–111)
Creatinine, Ser: 1.12 mg/dL (ref 0.61–1.24)
GFR calc Af Amer: >60 mL/min (ref >60)
GLUCOSE: 191 mg/dL — AB (ref 65–99)
POTASSIUM: 3.7 mmol/L (ref 3.5–5.1)
Sodium: 139 mmol/L (ref 135–145)
TOTAL PROTEIN: 6.6 g/dL (ref 6.5–8.1)

## 2016-03-05 LAB — CBC
HEMATOCRIT: 48.5 % (ref 39.0–52.0)
HEMOGLOBIN: 15.7 g/dL (ref 13.0–17.0)
MCH: 28.8 pg (ref 26.0–34.0)
MCHC: 32.4 g/dL (ref 30.0–36.0)
MCV: 88.8 fL (ref 78.0–100.0)
Platelets: 177 10*3/uL (ref 150–400)
RBC: 5.46 MIL/uL (ref 4.22–5.81)
RDW: 14.1 % (ref 11.5–15.5)
WBC: 6.2 10*3/uL (ref 4.0–10.5)

## 2016-03-05 LAB — PROTIME-INR
INR: 1.08 (ref 0.00–1.49)
PROTHROMBIN TIME: 14.2 s (ref 11.6–15.2)

## 2016-03-05 LAB — APTT: APTT: 34 s (ref 24–37)

## 2016-03-05 NOTE — Pre-Procedure Instructions (Signed)
[facility]  03/05/2016      [place], [place] [address] Phone: [phone] Fax: [phone]  [place], [place] [address] [place] [address] Phone: [phone] Fax: [phone]    Your procedure is scheduled on Wednesday, June 28th   Report to [facility] Admitting at 1:00 Pm   Call this number if you have problems the morning of surgery:  [phone]   Remember:  Do not eat food or drink liquids after midnight Tuesday.   Take these medicines the morning of surgery with A SIP OF WATER  : Xanax, Omeprazole, Tessalon   Do not wear jewelry - no rings or watches.  Do not wear lotions or colognes.   You may NOT  wear deodorant the day of surgery.              Men may shave face and neck.  Do not bring valuables to the hospital.  [facility] is not responsible for any belongings or valuables.  Contacts, dentures or bridgework may not be worn into surgery.    Patients discharged the day of surgery will not be allowed to drive home.   Please read over the following fact sheets that you were given. Shower instructions
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
## 2016-03-05 NOTE — Progress Notes (Signed)
PCP Dr Vic Blackbird Denies any heart studies in past. EKG today shows Atrial fibrillation. Trinna Post PA notified.

## 2016-03-06 ENCOUNTER — Ambulatory Visit
Admission: RE | Admit: 2016-03-06 | Discharge: 2016-03-06 | Disposition: A | Discharge status: Home / Self Care | Payer: Medicare Other | Source: Ambulatory Visit | Attending: Thoracic Surgery (Cardiothoracic Vascular Surgery) | Admitting: Thoracic Surgery (Cardiothoracic Vascular Surgery)

## 2016-03-06 DIAGNOSIS — R918 Other nonspecific abnormal finding of lung field: Secondary | ICD-10-CM

## 2016-03-10 ENCOUNTER — Encounter: Payer: Self-pay | Admitting: *Deleted

## 2016-03-10 ENCOUNTER — Other Ambulatory Visit: Payer: Self-pay | Admitting: *Deleted

## 2016-03-10 NOTE — Patient Outreach (Signed)
[facility]) Care Management  03/10/2016  FOXX KLARICH 07-06-1941 [id number]  RN received no return call back from patient. RN sent an unsuccessful outreach letter to patient with pamphlet regarding services available. RN will also refer to inpatient liaison for follow up since noted upcoming inpatient admission for potential needs. [place] Care Management [phone]

## 2016-03-11 ENCOUNTER — Ambulatory Visit (HOSPITAL_COMMUNITY): Payer: Medicare Other

## 2016-03-11 ENCOUNTER — Ambulatory Visit (HOSPITAL_COMMUNITY): Payer: Medicare Other | Admitting: Vascular Surgery

## 2016-03-11 ENCOUNTER — Encounter (HOSPITAL_COMMUNITY)
Admission: RE | Disposition: A | Discharge status: Home / Self Care | Payer: Self-pay | Source: Ambulatory Visit | Attending: Thoracic Surgery (Cardiothoracic Vascular Surgery)

## 2016-03-11 ENCOUNTER — Ambulatory Visit (HOSPITAL_COMMUNITY)
Admission: RE | Admit: 2016-03-11 | Discharge: 2016-03-11 | Disposition: A | Discharge status: Home / Self Care | Payer: Medicare Other | Source: Ambulatory Visit | Attending: Thoracic Surgery (Cardiothoracic Vascular Surgery) | Admitting: Thoracic Surgery (Cardiothoracic Vascular Surgery)

## 2016-03-11 DIAGNOSIS — Z79899 Other long term (current) drug therapy: Secondary | ICD-10-CM | POA: Insufficient documentation

## 2016-03-11 DIAGNOSIS — R911 Solitary pulmonary nodule: Secondary | ICD-10-CM

## 2016-03-11 DIAGNOSIS — Z419 Encounter for procedure for purposes other than remedying health state, unspecified: Secondary | ICD-10-CM

## 2016-03-11 DIAGNOSIS — Z87891 Personal history of nicotine dependence: Secondary | ICD-10-CM | POA: Diagnosis not present

## 2016-03-11 DIAGNOSIS — R591 Generalized enlarged lymph nodes: Secondary | ICD-10-CM

## 2016-03-11 DIAGNOSIS — C3412 Malignant neoplasm of upper lobe, left bronchus or lung: Secondary | ICD-10-CM

## 2016-03-11 DIAGNOSIS — K210 Gastro-esophageal reflux disease with esophagitis: Secondary | ICD-10-CM | POA: Insufficient documentation

## 2016-03-11 DIAGNOSIS — Z8546 Personal history of malignant neoplasm of prostate: Secondary | ICD-10-CM | POA: Insufficient documentation

## 2016-03-11 DIAGNOSIS — Z923 Personal history of irradiation: Secondary | ICD-10-CM | POA: Insufficient documentation

## 2016-03-11 DIAGNOSIS — F1026 Alcohol dependence with alcohol-induced persisting amnestic disorder: Secondary | ICD-10-CM | POA: Insufficient documentation

## 2016-03-11 DIAGNOSIS — F410 Panic disorder [episodic paroxysmal anxiety] without agoraphobia: Secondary | ICD-10-CM | POA: Insufficient documentation

## 2016-03-11 DIAGNOSIS — R599 Enlarged lymph nodes, unspecified: Secondary | ICD-10-CM

## 2016-03-11 HISTORY — PX: VIDEO BRONCHOSCOPY WITH ENDOBRONCHIAL NAVIGATION: SHX6175

## 2016-03-11 HISTORY — PX: VIDEO BRONCHOSCOPY WITH ENDOBRONCHIAL ULTRASOUND: SHX6177

## 2016-03-11 SURGERY — VIDEO BRONCHOSCOPY WITH ENDOBRONCHIAL NAVIGATION
Anesthesia: General

## 2016-03-11 MED ORDER — EPINEPHRINE HCL 1 MG/ML IJ SOLN
INTRAMUSCULAR | Status: AC
  Filled 2016-03-11: qty 2

## 2016-03-11 MED ORDER — PROPOFOL 10 MG/ML IV BOLUS
INTRAVENOUS | Status: AC
  Filled 2016-03-11: qty 40

## 2016-03-11 MED ORDER — LACTATED RINGERS IV SOLN
INTRAVENOUS | Status: DC | PRN
  Administered 2016-03-11: 14:00:00 via INTRAVENOUS

## 2016-03-11 MED ORDER — PHENYLEPHRINE 40 MCG/ML (10ML) SYRINGE FOR IV PUSH (FOR BLOOD PRESSURE SUPPORT)
PREFILLED_SYRINGE | INTRAVENOUS | Status: AC
  Filled 2016-03-11: qty 20

## 2016-03-11 MED ORDER — SUGAMMADEX SODIUM 200 MG/2ML IV SOLN
INTRAVENOUS | Status: DC | PRN
  Administered 2016-03-11: 160 mg via INTRAVENOUS

## 2016-03-11 MED ORDER — PROPOFOL 10 MG/ML IV BOLUS
INTRAVENOUS | Status: DC | PRN
  Administered 2016-03-11: 100 mg via INTRAVENOUS

## 2016-03-11 MED ORDER — ROCURONIUM BROMIDE 100 MG/10ML IV SOLN
INTRAVENOUS | Status: DC | PRN
  Administered 2016-03-11: 50 mg via INTRAVENOUS

## 2016-03-11 MED ORDER — PHENYLEPHRINE HCL 10 MG/ML IJ SOLN: 10.0000 mg | INTRAVENOUS | Status: DC | PRN

## 2016-03-11 MED ORDER — EPINEPHRINE HCL 1 MG/ML IJ SOLN
INTRAMUSCULAR | Status: DC | PRN
  Administered 2016-03-11: 1 mg

## 2016-03-11 MED ORDER — LACTATED RINGERS IV SOLN
Freq: Once | INTRAVENOUS | Status: AC
  Administered 2016-03-11: 13:00:00 via INTRAVENOUS

## 2016-03-11 MED ORDER — HEPARIN SODIUM (PORCINE) 1000 UNIT/ML IJ SOLN
INTRAMUSCULAR | Status: AC
  Filled 2016-03-11: qty 1

## 2016-03-11 MED ORDER — MIDAZOLAM HCL 2 MG/2ML IJ SOLN
INTRAMUSCULAR | Status: AC
  Filled 2016-03-11: qty 2

## 2016-03-11 MED ORDER — PROTAMINE SULFATE 10 MG/ML IV SOLN
INTRAVENOUS | Status: AC
  Filled 2016-03-11: qty 5

## 2016-03-11 MED ORDER — FENTANYL CITRATE (PF) 250 MCG/5ML IJ SOLN
INTRAMUSCULAR | Status: AC
  Filled 2016-03-11: qty 5

## 2016-03-11 MED ORDER — DEXAMETHASONE SODIUM PHOSPHATE 4 MG/ML IJ SOLN
INTRAMUSCULAR | Status: DC | PRN
  Administered 2016-03-11: 10 mg via INTRAVENOUS

## 2016-03-11 MED ORDER — FENTANYL CITRATE (PF) 100 MCG/2ML IJ SOLN
INTRAMUSCULAR | Status: DC | PRN
  Administered 2016-03-11 (×4): 50 ug via INTRAVENOUS

## 2016-03-11 MED ORDER — LIDOCAINE HCL (CARDIAC) 20 MG/ML IV SOLN
INTRAVENOUS | Status: DC | PRN
  Administered 2016-03-11: 60 mg via INTRAVENOUS

## 2016-03-11 MED ORDER — PROMETHAZINE HCL 25 MG/ML IJ SOLN: 6.2500 mg | INTRAMUSCULAR | Status: DC | PRN

## 2016-03-11 MED ORDER — 0.9 % SODIUM CHLORIDE (POUR BTL) OPTIME
TOPICAL | Status: DC | PRN
  Administered 2016-03-11: 1000 mL

## 2016-03-11 MED ORDER — ONDANSETRON HCL 4 MG/2ML IJ SOLN
INTRAMUSCULAR | Status: DC | PRN
  Administered 2016-03-11: 4 mg via INTRAVENOUS

## 2016-03-11 MED ORDER — FENTANYL CITRATE (PF) 100 MCG/2ML IJ SOLN: 25.0000 ug | INTRAMUSCULAR | Status: DC | PRN

## 2016-03-11 MED ORDER — PHENYLEPHRINE HCL 10 MG/ML IJ SOLN
10.0000 mg | INTRAVENOUS | Status: DC | PRN
  Administered 2016-03-11: 10 ug/min via INTRAVENOUS

## 2016-03-11 MED ORDER — MIDAZOLAM HCL 5 MG/5ML IJ SOLN
INTRAMUSCULAR | Status: DC | PRN
  Administered 2016-03-11 (×2): 1 mg via INTRAVENOUS

## 2016-03-11 MED ORDER — EPHEDRINE SULFATE 50 MG/ML IJ SOLN
INTRAMUSCULAR | Status: DC | PRN
  Administered 2016-03-11: 10 mg via INTRAVENOUS

## 2016-03-11 SURGICAL SUPPLY — 53 items
ADAPTER BRONCH F/PENTAX (ADAPTER) ×3 IMPLANT
BAG SNAP BAND KOVER 36X36 (MISCELLANEOUS) ×3 IMPLANT
BRUSH CYTOL CELLEBRITY 1.5X140 (MISCELLANEOUS) IMPLANT
BRUSH SUPERTRAX BIOPSY (INSTRUMENTS) IMPLANT
BRUSH SUPERTRAX NDL-TIP CYTO (INSTRUMENTS) ×3 IMPLANT
CANISTER SUCTION 2500CC (MISCELLANEOUS) ×6 IMPLANT
CHANNEL WORK EXTEND EDGE 180 (KITS) IMPLANT
CHANNEL WORK EXTEND EDGE 45 (KITS) IMPLANT
CHANNEL WORK EXTEND EDGE 90 (KITS) IMPLANT
CONT SPEC 4OZ CLIKSEAL STRL BL (MISCELLANEOUS) ×9 IMPLANT
CONT SPECI 4OZ STER CLIK (MISCELLANEOUS) ×3 IMPLANT
COTTONBALL LRG STERILE PKG (GAUZE/BANDAGES/DRESSINGS) IMPLANT
COVER DOME SNAP 22 D (MISCELLANEOUS) ×3 IMPLANT
COVER TABLE BACK 60X90 (DRAPES) ×6 IMPLANT
DRAPE PROXIMA HALF (DRAPES) ×3 IMPLANT
FILTER STRAW FLUID ASPIR (MISCELLANEOUS) IMPLANT
FORCEPS BIOP RJ4 1.8 (CUTTING FORCEPS) IMPLANT
FORCEPS BIOP SUPERTRX PREMAR (INSTRUMENTS) ×3 IMPLANT
GAUZE SPONGE 4X4 12PLY STRL (GAUZE/BANDAGES/DRESSINGS) ×3 IMPLANT
GLOVE SURG SIGNA 7.5 PF LTX (GLOVE) ×6 IMPLANT
GOWN STRL REUS W/ TWL XL LVL3 (GOWN DISPOSABLE) ×2 IMPLANT
GOWN STRL REUS W/TWL XL LVL3 (GOWN DISPOSABLE) ×4
KIT CLEAN ENDO COMPLIANCE (KITS) ×9 IMPLANT
KIT PROCEDURE EDGE 180 (KITS) IMPLANT
KIT PROCEDURE EDGE 45 (KITS) IMPLANT
KIT PROCEDURE EDGE 90 (KITS) ×3 IMPLANT
KIT ROOM TURNOVER OR (KITS) ×6 IMPLANT
MARKER SKIN DUAL TIP RULER LAB (MISCELLANEOUS) ×6 IMPLANT
NEEDLE 22X1 1/2 (OR ONLY) (NEEDLE) IMPLANT
NEEDLE BIOPSY TRANSBRONCH 21G (NEEDLE) IMPLANT
NEEDLE BLUNT 18X1 FOR OR ONLY (NEEDLE) IMPLANT
NEEDLE FILTER BLUNT 18X 1/2SAF (NEEDLE) ×2
NEEDLE FILTER BLUNT 18X1 1/2 (NEEDLE) ×1 IMPLANT
NEEDLE SONO TIP II EBUS (NEEDLE) ×3 IMPLANT
NEEDLE SUPERTRX PREMARK BIOPSY (NEEDLE) ×3 IMPLANT
NS IRRIG 1000ML POUR BTL (IV SOLUTION) ×6 IMPLANT
OIL SILICONE PENTAX (PARTS (SERVICE/REPAIRS)) ×3 IMPLANT
PAD ARMBOARD 7.5X6 YLW CONV (MISCELLANEOUS) ×12 IMPLANT
PATCHES PATIENT (LABEL) ×9 IMPLANT
SYR 20CC LL (SYRINGE) ×6 IMPLANT
SYR 20ML ECCENTRIC (SYRINGE) ×6 IMPLANT
SYR 30ML LL (SYRINGE) ×3 IMPLANT
SYR 3ML LL SCALE MARK (SYRINGE) ×3 IMPLANT
SYR 5ML LL (SYRINGE) ×6 IMPLANT
SYR 5ML LUER SLIP (SYRINGE) ×3 IMPLANT
SYR CONTROL 10ML LL (SYRINGE) IMPLANT
SYRINGE 60CC LL (MISCELLANEOUS) ×3 IMPLANT
TOWEL OR 17X24 6PK STRL BLUE (TOWEL DISPOSABLE) ×6 IMPLANT
TRAP SPECIMEN MUCOUS 40CC (MISCELLANEOUS) ×6 IMPLANT
TUBE CONNECTING 20'X1/4 (TUBING) ×3
TUBE CONNECTING 20X1/4 (TUBING) ×6 IMPLANT
UNDERPAD 30X30 (UNDERPADS AND DIAPERS) ×3 IMPLANT
WATER STERILE IRR 1000ML POUR (IV SOLUTION) ×6 IMPLANT

## 2016-03-11 NOTE — Interval H&P Note (Signed)
History and Physical Interval Note:  03/11/2016 1:41 PM  Gary Sweeney  has presented today for surgery, with the diagnosis of LEFT LUNG NODULE ADENOPATHY  The various methods of treatment have been discussed with the patient and family. After consideration of risks, benefits and other options for treatment, the patient has consented to  Procedure(s): VIDEO BRONCHOSCOPY WITH ENDOBRONCHIAL NAVIGATION (N/A) VIDEO BRONCHOSCOPY WITH ENDOBRONCHIAL ULTRASOUND (N/A) as a surgical intervention .  The patient's history has been reviewed, patient examined, no change in status, stable for surgery.  I have reviewed the patient's chart and labs.  Questions were answered to the patient's satisfaction.     Melrose Nakayama
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
## 2016-03-11 NOTE — Anesthesia Preprocedure Evaluation (Addendum)
Anesthesia Evaluation  Patient identified by MRN, date of birth, ID band Patient awake    Reviewed: Allergy & Precautions, NPO status , Patient's Chart, lab work & pertinent test results  Airway Mallampati: II  TM Distance: >3 FB Neck ROM: Full    Dental  (+) Dental Advisory Given,    Pulmonary shortness of breath and with exertion, former smoker,    breath sounds clear to auscultation       Cardiovascular Exercise Tolerance: Poor + DOE  + dysrhythmias  Rhythm:Regular Rate:Normal     Neuro/Psych  Headaches, Seizures -, Well Controlled,  PSYCHIATRIC DISORDERS Anxiety Panic disorder    GI/Hepatic GERD  Medicated,(+)     substance abuse  alcohol use,   Endo/Other    Renal/GU Renal InsufficiencyRenal disease     Musculoskeletal  (+) Arthritis , Osteoarthritis,    Abdominal   Peds  Hematology negative hematology ROS (+)   Anesthesia Other Findings Prostate Cancer s/p xrt  Reproductive/Obstetrics                          BP Readings from Last 3 Encounters:  03/05/16 120/70  02/25/16 127/71  02/17/16 128/74   Lab Results  Component Value Date   WBC 6.2 03/05/2016   HGB 15.7 03/05/2016   HCT 48.5 03/05/2016   MCV 88.8 03/05/2016   PLT 177 03/05/2016     Chemistry      Component Value Date/Time   NA 139 03/05/2016 1128   K 3.7 03/05/2016 1128   CL 106 03/05/2016 1128   CO2 28 03/05/2016 1128   BUN 13 03/05/2016 1128   CREATININE 1.12 03/05/2016 1128   CREATININE 1.09 10/25/2013 1122      Component Value Date/Time   CALCIUM 9.0 03/05/2016 1128   ALKPHOS 106 03/05/2016 1128   AST 16 03/05/2016 1128   ALT 14* 03/05/2016 1128   BILITOT 0.6 03/05/2016 1128     Lab Results  Component Value Date   INR 1.08 03/05/2016     Anesthesia Physical Anesthesia Plan  ASA: III  Anesthesia Plan: General   Post-op Pain Management:    Induction: Intravenous  Airway Management  Planned: Oral ETT  Additional Equipment:   Intra-op Plan:   Post-operative Plan: Extubation in OR  Informed Consent: I have reviewed the patients History and Physical, chart, labs and discussed the procedure including the risks, benefits and alternatives for the proposed anesthesia with the patient or authorized representative who has indicated his/her understanding and acceptance.   Dental advisory given  Plan Discussed with: CRNA  Anesthesia Plan Comments:         Anesthesia Quick Evaluation

## 2016-03-11 NOTE — Discharge Instructions (Signed)
Do not drive or engage in heavy physical activity for 24 hours  You may resume normal activities tomorrow  You may cough up small amounts of blood over the next few days.  You may use Tylenol or ibuprofen(advil) if needed for discomfort.  You may use over the counter cough medication if needed  Call [phone] if you develop chest pain, shortness of breath or fever > 101 F  Call [phone] if you cough up more than 2 tablespoons of blood  My office will contact you with follow up information

## 2016-03-11 NOTE — Transfer of Care (Signed)
Immediate Anesthesia Transfer of Care Note  Patient: [name]  Procedure(s) Performed: Procedure(s): VIDEO BRONCHOSCOPY WITH ENDOBRONCHIAL NAVIGATION (N/A) VIDEO BRONCHOSCOPY WITH ENDOBRONCHIAL ULTRASOUND (N/A)  Patient Location: PACU  Anesthesia Type:General  Level of Consciousness: awake, alert  and oriented  Airway & Oxygen Therapy: Patient Spontanous Breathing and Patient connected to nasal cannula oxygen  Post-op Assessment: Report given to RN and Post -op Vital signs reviewed and stable  Post vital signs: Reviewed and stable  Last Vitals:  Filed Vitals:   03/11/16 1607  Pulse: 80  Temp: 36.4 C  Resp: 21    Last Pain: There were no vitals filed for this visit.       Complications: No apparent anesthesia complications

## 2016-03-11 NOTE — Anesthesia Procedure Notes (Signed)
Procedure Name: Intubation Date/Time: 03/11/2016 2:16 PM Performed by: Clearnce Sorrel Pre-anesthesia Checklist: Patient identified, Emergency Drugs available, Suction available, Patient being monitored and Timeout performed Patient Re-evaluated:Patient Re-evaluated prior to inductionOxygen Delivery Method: Circle system utilized Preoxygenation: Pre-oxygenation with 100% oxygen Intubation Type: IV induction Ventilation: Mask ventilation without difficulty Laryngoscope Size: Mac and 3 Grade View: Grade II Tube type: Oral Tube size: 8.5 mm Number of attempts: 1 Airway Equipment and Method: Stylet Placement Confirmation: ETT inserted through vocal cords under direct vision,  positive ETCO2 and breath sounds checked- equal and bilateral Secured at: 23 cm Tube secured with: Tape Dental Injury: Teeth and Oropharynx as per pre-operative assessment

## 2016-03-11 NOTE — Brief Op Note (Signed)
03/11/2016  4:04 PM  PATIENT:  Gary Sweeney  75 y.o. male  PRE-OPERATIVE DIAGNOSIS:  LEFT LUNG NODULE with HILAR ADENOPATHY  POST-OPERATIVE DIAGNOSIS:  CARCINOMA - CLINICAL STAGE IIA  PROCEDURE:  Procedure(s): VIDEO BRONCHOSCOPY WITH ENDOBRONCHIAL NAVIGATION (N/A) VIDEO BRONCHOSCOPY WITH ENDOBRONCHIAL ULTRASOUND (N/A)  SURGEON:  Surgeon(s) and Role:    * Melrose Nakayama, MD - Primary  PHYSICIAN ASSISTANT:   ASSISTANTS: none   ANESTHESIA:   general  EBL:     BLOOD ADMINISTERED:none  DRAINS: none   LOCAL MEDICATIONS USED:  NONE  SPECIMEN:  Source of Specimen:  level 7 and 10L lymph nodes and LUL nodule  DISPOSITION OF SPECIMEN:  PATHOLOGY  COUNTS:  NO -  PLAN OF CARE: Discharge to home after PACU  PATIENT DISPOSITION:  PACU - hemodynamically stable.   Delay start of Pharmacological VTE agent (>24hrs) due to surgical blood loss or risk of bleeding: not applicable

## 2016-03-12 ENCOUNTER — Telehealth: Payer: Self-pay | Admitting: *Deleted

## 2016-03-12 ENCOUNTER — Encounter: Payer: Self-pay | Admitting: *Deleted

## 2016-03-12 DIAGNOSIS — R918 Other nonspecific abnormal finding of lung field: Secondary | ICD-10-CM | POA: Insufficient documentation

## 2016-03-12 HISTORY — DX: Other nonspecific abnormal finding of lung field: R91.8

## 2016-03-12 NOTE — Telephone Encounter (Signed)
Oncology Nurse Navigator Documentation  Oncology Nurse Navigator Flowsheets 03/12/2016  Navigator Location [facility]  Navigator Encounter Type Introductory phone call  Treatment Phase Pre-Tx/Tx Discussion  Barriers/Navigation Needs Coordination of Care  Interventions Coordination of Care  Coordination of Care Appts  Acuity Level 1  Acuity Level 1 Initial guidance, education and coordination as needed  Time Spent with Patient 15   I received referral on Mr. Capek today.  I called and spoke with his caregiver, Laverna Peace.  I gave him the appt to see Dr. Julien Nordmann on 03/20/16 arrive at 9:45 with labs and to see Dr. Julien Nordmann. Jimmy verbalized understanding of appt time and place.

## 2016-03-13 NOTE — Anesthesia Postprocedure Evaluation (Signed)
Anesthesia Post Note  Patient: Gary Sweeney  Procedure(s) Performed: Procedure(s) (LRB): VIDEO BRONCHOSCOPY WITH ENDOBRONCHIAL NAVIGATION (N/A) VIDEO BRONCHOSCOPY WITH ENDOBRONCHIAL ULTRASOUND (N/A)  Patient location during evaluation: PACU Anesthesia Type: General Level of consciousness: awake and alert Pain management: pain level controlled Vital Signs Assessment: post-procedure vital signs reviewed and stable Respiratory status: spontaneous breathing, nonlabored ventilation, respiratory function stable and patient connected to nasal cannula oxygen Cardiovascular status: blood pressure returned to baseline and stable Postop Assessment: no signs of nausea or vomiting Anesthetic complications: no     Last Vitals:  Filed Vitals:   03/11/16 1723 03/11/16 1730  BP: 124/76   Pulse: 78 78  Temp:  36.6 C  Resp: 16 15    Last Pain:  Filed Vitals:   03/12/16 1432  PainSc: 0-No pain   Pain Goal:                 Tiajuana Amass

## 2016-03-13 NOTE — Op Note (Signed)
NAMELOGEN, HEINTZELMAN NO.:  [id number]  MEDICAL RECORD NO.:  [id number]  LOCATION:  [facility]                         FACILITY:  [place]  PHYSICIAN:  Revonda Standard. Roxan Hockey, M.D.DATE OF BIRTH:  1941/05/28  DATE OF PROCEDURE:  03/11/2016 DATE OF DISCHARGE:  03/11/2016                              OPERATIVE REPORT   PREOPERATIVE DIAGNOSIS:  Left upper lobe nodule with hilar adenopathy.  POSTOPERATIVE DIAGNOSIS:  Carcinoma, clinical stage IIA.  PROCEDURES:   Electromagnetic navigational bronchoscopy with needle aspirations, brushings  and biopsies.   Endobronchial ultrasound with mediastinal and hilar lymph node aspirations.  SURGEON:  Revonda Standard. Roxan Hockey, M.D.  ASSISTANT:  None.  ANESTHESIA:  General.  FINDINGS:   -Aspirations of level 7 and 10L nodes were negative.  -Extrinsic compression around the left upper lobe bronchi was noted.   -Needle aspirations and brushings revealed carcinoma, unclear whether small cell or non-small cell on Quick Prep.  CLINICAL NOTE:  Mr. Gruenberg is a 75 year old man with multiple issues, who recently was found to have a left upper lobe nodule with hilar and possible mediastinal adenopathy.  He was advised to undergo navigational bronchoscopy and endobronchial ultrasound for diagnostic purposes.  The indications, risks, benefits and alternatives were discussed in detail with the patient and his family, he accepted the risks and agreed to proceed.  OPERATIVE NOTE:  Mr. Dolney was brought to the operating room on March 11, 2016. He had induction of general anesthesia and was intubated. After performing a time-out, flexible fiberoptic bronchoscopy was performed via the endotracheal tube.  No endobronchial lesions were seen, but there was extrinsic narrowing of the left upper lobe bronchi and blunting of the carina in that area.  The bronchoscope was removed.  The endobronchial ultrasound probe was advanced.  Systematic  inspection of the lymph node stations revealed relatively limited targets for aspiration.  There was a level 7 node that appeared suitable and multiple aspirations were performed of it.  With each aspiration, the needle was advanced into the lymph node with ultrasound visualization. Suction was applied and 10-12 passes were made with the needle.  The specimen was applied to slides and also placed into cytologic preparation fluid.  This process was repeated three times.  Next, a level 10L node was identified that had an adequate window for aspirations and the process was repeated with that node.  These specimens then were sent to Pathology for examination.  The endobronchial ultrasound probe was removed.  The bronchoscope was reinserted and the locatable guide for navigation was placed.  Registration was performed and there was good correlation of the video and virtual bronchoscopy.  The Quick Prep on the nodes came back with no tumor seen.  The scope then was directed to the left upper lobe and the appropriate segmental bronchus was identified and the locatable guide was advanced within 0.5 cm of the nodule with good alignment.  The locatable guide was removed and aspirations were performed.  The needle was advanced into the nodule, suction was applied, and 10-12 passes were made.  This process was repeated three times.  All sampling was done with fluoroscopy visualization.  Needle brushings were obtained and these specimens were likewise sent  for Quick Prep.  While awaiting those specimens, it was noted that the sheath for the locatable guide had moved slightly, it was still in relatively close proximity to the nodule.  Additional samples were taken from this area with a needle and with brush.  Biopsies then were obtained as well.  The locatable guide was reinserted and once again positioned directly in line with the center of the nodule and biopsies were taken from this area as well.   Extensive biopsies were done to obtain as much tissue as possible.  The Quick Prep on the needle aspirations and brushings showed carcinoma, it was not clear whether this was non-small cell or small cell based on the Quick Prep that will await permanent pathology.  The sheath was removed.  The left-sided airways were irrigated with saline to clear all secretions.  The bronchoscope was removed.  The patient was extubated in the operating room and taken to the postanesthetic care unit in good condition.  The total fluoroscopy time was 3.5 minutes with 29.91 mGy of radiation.     Revonda Standard Roxan Hockey, M.D.     SCH/MEDQ  D:  03/12/2016  T:  03/13/2016  Job:  [id number]

## 2016-03-16 ENCOUNTER — Telehealth: Payer: Self-pay | Admitting: *Deleted

## 2016-03-16 NOTE — Telephone Encounter (Signed)
Oncology Nurse Navigator Documentation  Oncology Nurse Navigator Flowsheets 03/16/2016  Navigator Encounter Type Telephone  Telephone Outgoing Call  Treatment Phase Pre-Tx/Tx Discussion  Barriers/Navigation Needs Coordination of Care  Interventions Coordination of Care  Coordination of Care Appts  Acuity Level 1  Time Spent with Patient 15   Per Dr. Worthy Flank request, I called to change appt time for patient.  I left vm message with Laverna Peace, patient's brother.

## 2016-03-20 ENCOUNTER — Ambulatory Visit: Payer: Medicare Other | Admitting: Internal Medicine

## 2016-03-20 ENCOUNTER — Other Ambulatory Visit: Payer: Medicare Other

## 2016-03-20 ENCOUNTER — Telehealth: Payer: Self-pay | Admitting: Internal Medicine

## 2016-03-20 ENCOUNTER — Ambulatory Visit (HOSPITAL_BASED_OUTPATIENT_CLINIC_OR_DEPARTMENT_OTHER): Payer: Medicare Other | Admitting: Internal Medicine

## 2016-03-20 ENCOUNTER — Encounter: Payer: Self-pay | Admitting: Internal Medicine

## 2016-03-20 VITALS — BP 119/71 | HR 66 | Temp 98.1°F | Resp 18 | Ht 70.0 in | Wt 175.9 lb

## 2016-03-20 DIAGNOSIS — C3412 Malignant neoplasm of upper lobe, left bronchus or lung: Secondary | ICD-10-CM

## 2016-03-20 DIAGNOSIS — K769 Liver disease, unspecified: Secondary | ICD-10-CM | POA: Diagnosis not present

## 2016-03-20 DIAGNOSIS — M549 Dorsalgia, unspecified: Secondary | ICD-10-CM

## 2016-03-20 DIAGNOSIS — R109 Unspecified abdominal pain: Secondary | ICD-10-CM | POA: Diagnosis not present

## 2016-03-20 DIAGNOSIS — C3492 Malignant neoplasm of unspecified part of left bronchus or lung: Secondary | ICD-10-CM

## 2016-03-20 HISTORY — DX: Malignant neoplasm of unspecified part of left bronchus or lung: C34.92

## 2016-03-20 MED ORDER — TRAMADOL HCL 50 MG PO TABS: 50.0000 mg | ORAL_TABLET | Freq: Two times a day (BID) | ORAL | Status: DC | PRN

## 2016-03-20 NOTE — Telephone Encounter (Signed)
Gave and printed appt sched and avs for pt for Aug °
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
## 2016-03-27 ENCOUNTER — Ambulatory Visit (HOSPITAL_COMMUNITY)
Admission: RE | Admit: 2016-03-27 | Discharge: 2016-03-27 | Disposition: A | Discharge status: Home / Self Care | Payer: Medicare Other | Source: Ambulatory Visit | Attending: Internal Medicine | Admitting: Internal Medicine

## 2016-03-27 ENCOUNTER — Ambulatory Visit (HOSPITAL_COMMUNITY): Admission: RE | Admit: 2016-03-27 | Payer: Medicare Other | Source: Ambulatory Visit

## 2016-03-27 DIAGNOSIS — I639 Cerebral infarction, unspecified: Secondary | ICD-10-CM | POA: Insufficient documentation

## 2016-03-27 DIAGNOSIS — C3492 Malignant neoplasm of unspecified part of left bronchus or lung: Secondary | ICD-10-CM | POA: Insufficient documentation

## 2016-03-27 MED ORDER — GADOBENATE DIMEGLUMINE 529 MG/ML IV SOLN
20.0000 mL | Freq: Once | INTRAVENOUS | Status: AC | PRN
  Administered 2016-03-27: 10 mL via INTRAVENOUS

## 2016-03-30 ENCOUNTER — Telehealth: Payer: Self-pay | Admitting: *Deleted

## 2016-03-30 ENCOUNTER — Ambulatory Visit (HOSPITAL_COMMUNITY)
Admission: RE | Admit: 2016-03-30 | Discharge: 2016-03-30 | Disposition: A | Discharge status: Home / Self Care | Payer: Medicare Other | Source: Ambulatory Visit | Attending: Internal Medicine | Admitting: Internal Medicine

## 2016-03-30 ENCOUNTER — Other Ambulatory Visit: Payer: Self-pay | Admitting: Internal Medicine

## 2016-03-30 DIAGNOSIS — C3492 Malignant neoplasm of unspecified part of left bronchus or lung: Secondary | ICD-10-CM | POA: Diagnosis present

## 2016-03-30 DIAGNOSIS — R918 Other nonspecific abnormal finding of lung field: Secondary | ICD-10-CM | POA: Insufficient documentation

## 2016-03-30 DIAGNOSIS — C3412 Malignant neoplasm of upper lobe, left bronchus or lung: Secondary | ICD-10-CM | POA: Insufficient documentation

## 2016-03-30 DIAGNOSIS — K760 Fatty (change of) liver, not elsewhere classified: Secondary | ICD-10-CM | POA: Insufficient documentation

## 2016-03-30 MED ORDER — GADOBENATE DIMEGLUMINE 529 MG/ML IV SOLN
20.0000 mL | Freq: Once | INTRAVENOUS | Status: AC | PRN
  Administered 2016-03-30: 16 mL via INTRAVENOUS

## 2016-03-30 NOTE — Telephone Encounter (Signed)
Oncology Nurse Navigator Documentation  Oncology Nurse Navigator Flowsheets 03/30/2016  Navigator Encounter Type Telephone  Telephone Outgoing Call  Treatment Phase Pre-Tx/Tx Discussion  Barriers/Navigation Needs Transportation  Interventions Transportations  Education Method Verbal  Acuity Level 2  Time Spent with Patient 17   Spoke with patient's brother today.  I listened as he explained his difficulty getting his brother to different appt's.  I gave him a few numbers to call for assistance with transportation.  I also stated I can help them fill out scat application at next visit.  Gary Sweeney was thankful for the call and help.

## 2016-03-30 NOTE — Telephone Encounter (Signed)
Patient's brother called returning call from Navigator.  Asked for return call from Navigator.   This nurse listened to him verbalize about trouble caring for his brother.  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  We needing help.  My wife cannot leave the house or do things with or for grand kids doing for him.  He has children and they don't come or call.  Where will this biopsy be?"  Advised [facility] is site per order.  Mentioned [employer], private transportation companies and [employer].  [employer] form printed for provider to start transportation request.

## 2016-03-30 NOTE — Progress Notes (Signed)
Thoracic Location of Tumor / Histology: questionable stage IIIA (t1b, N3, M0) versus stage IV (T1b, N2, M1b) non-small cell lung cancer favoring squamous cell carcinoma presented with left upper lobe lung nodule in addition to mediastinal lymphadenopathy and questionable lesion in the liver suspicious for solitary metastasis  Patient presented to his primary care physician complaining of persistent cough and shortness of breath for 2 months.  Biopsies revealed:   04/02/16 Diagnosis Liver, needle/core biopsy, Right Lobe - POORLY DIFFERENTIATED CARCINOMA. - SEE COMMENT.  03/11/16 Diagnosis FINE NEEDLE ASPIRATION, ENDOSCOPIC SPECIMEN A, EBUS LEVEL 7 NODE (SPECIMEN 1 OF 5, COLLECTED ON 03/11/2016): NO MALIGNANT CELLS IDENTIFIED. LYMPHOID TISSUE PRESENT.  Diagnosis FINE NEEDLE ASPIRATION, EBUS, 10L, B (SPECIMEN 2 OF 5, COLLECTED ON 6/28/7): NO MALIGNANT CELLS IDENTIFIED. VERY SCANT LYMPHOID TISSUE PRESENT.  Diagnosis TRANSBRONCHIAL NEEDLE ASPIRATION SPECIMEN C, NAVIGATION LEFT UPPER LOBE NODULE (SPECIMEN 3 OF 5 COLLECTED 03/11/2016) MALIGNANT CELLS PRESENT CONSISTENT WITH POORLY DIFFERENTIATED CARCINOMA.  Diagnosis TRANSBRONCHIAL NEEDLE ASPIRATION SPECIMEN D, NAVIGATION, LEFT UPPER LOBE BRUSHING, (SPECIMEN 4 OF 5, COLLECTED ON 03/11/16): MALIGNANT CELLS PRESENT CONSISTENT WITH POORLY DIFFERENTIATED CARCINOMA, SEE COMMENT.  Diagnosis BRONCHIAL LAVAGE, E (SPECIMEN 5 OF 5, COLLECTED ON 03/11/16): NO MALIGNANT CELLS IDENTIFIED.  Diagnosis Lung, biopsy, Left Upper Lobe - POORLY DIFFERENTIATED CARCINOMA, SEE COMMENT.  Tobacco/Marijuana/Snuff/ETOH use: smoking 1 pack per day for around 55 years and quit 2 years ago. He also has a history of alcohol and drug abuse but not recently.  Past/Anticipated interventions by cardiothoracic surgery, if any: 03/11/16 -Procedure: VIDEO BRONCHOSCOPY WITH ENDOBRONCHIAL NAVIGATION;  Surgeon: Melrose Nakayama, MD  Past/Anticipated interventions by  medical oncology, if any: concurrent chemoradiation with weekly carboplatin for AUC of 2 and paclitaxel 45 MG/M2.  Signs/Symptoms  Weight changes, if any: yes - has lost 17 lbs over the last 3-4 months  Respiratory complaints, if any: yes - reports having a productive cough with yellow sputum.  Reports shortness of breath with activity.  Hemoptysis, if any: no  Pain issues, if any:  Yes - reports havnig sharp pain in his right abdomen especially with coughing that started after his liver biopsy on 04/02/16.  He was given a prescription for Tramadol but says it does not help.  SAFETY ISSUES:  Prior radiation?  Yes - prostate 10-12 years ago by Dr. Rosana Hoes  Pacemaker/ICD? no   Possible current pregnancy?no  Is the patient on methotrexate? no  Current Complaints / other details:  Patient is here with his sister in law.  BP 133/82 (BP Location: Right Arm, Patient Position: Sitting)   Pulse (!) 102   Temp 98.1 F (36.7 C) (Oral)   Ht '5\' 10"'$  (1.778 m)   Wt 170 lb 6.4 oz (77.3 kg)   SpO2 99%   BMI 24.45 kg/m    Wt Readings from Last 3 Encounters:  04/08/16 170 lb 6.4 oz (77.3 kg)  04/02/16 175 lb (79.4 kg)  03/20/16 175 lb 14.4 oz (79.8 kg)

## 2016-03-30 NOTE — Telephone Encounter (Signed)
Oncology Nurse Navigator Documentation  Oncology Nurse Navigator Flowsheets 03/30/2016  Navigator Encounter Type Telephone  Treatment Phase Pre-Tx/Tx Discussion  Barriers/Navigation Needs Education  Education Other  Interventions Education Method  Education Method Verbal  Acuity Level 1  Time Spent with Patient 15   Per Dr. Julien Nordmann, I called patient's care giver, his brother Gary Sweeney.  I left vm message that radiology will be calling to set up an appt for biopsy.  I left my name and phone number to call.

## 2016-04-01 ENCOUNTER — Other Ambulatory Visit: Payer: Self-pay | Admitting: Radiology

## 2016-04-02 ENCOUNTER — Other Ambulatory Visit: Payer: Self-pay | Admitting: Radiology

## 2016-04-02 ENCOUNTER — Ambulatory Visit (HOSPITAL_COMMUNITY)
Admission: RE | Admit: 2016-04-02 | Discharge: 2016-04-02 | Disposition: A | Discharge status: Home / Self Care | Payer: Medicare Other | Source: Ambulatory Visit | Attending: Internal Medicine | Admitting: Internal Medicine

## 2016-04-02 DIAGNOSIS — Z79899 Other long term (current) drug therapy: Secondary | ICD-10-CM | POA: Insufficient documentation

## 2016-04-02 DIAGNOSIS — C3492 Malignant neoplasm of unspecified part of left bronchus or lung: Secondary | ICD-10-CM | POA: Diagnosis present

## 2016-04-02 DIAGNOSIS — K219 Gastro-esophageal reflux disease without esophagitis: Secondary | ICD-10-CM | POA: Diagnosis not present

## 2016-04-02 DIAGNOSIS — Z8546 Personal history of malignant neoplasm of prostate: Secondary | ICD-10-CM | POA: Diagnosis not present

## 2016-04-02 DIAGNOSIS — F1020 Alcohol dependence, uncomplicated: Secondary | ICD-10-CM | POA: Diagnosis not present

## 2016-04-02 DIAGNOSIS — Z87891 Personal history of nicotine dependence: Secondary | ICD-10-CM | POA: Diagnosis not present

## 2016-04-02 DIAGNOSIS — C787 Secondary malignant neoplasm of liver and intrahepatic bile duct: Secondary | ICD-10-CM | POA: Diagnosis not present

## 2016-04-02 LAB — CBC
HCT: 44.9 % (ref 39.0–52.0)
HEMOGLOBIN: 14.8 g/dL (ref 13.0–17.0)
MCH: 29.2 pg (ref 26.0–34.0)
MCHC: 33 g/dL (ref 30.0–36.0)
MCV: 88.7 fL (ref 78.0–100.0)
Platelets: 182 10*3/uL (ref 150–400)
RBC: 5.06 MIL/uL (ref 4.22–5.81)
RDW: 14.1 % (ref 11.5–15.5)
WBC: 5.8 10*3/uL (ref 4.0–10.5)

## 2016-04-02 LAB — PROTIME-INR
INR: 1.08 (ref 0.00–1.49)
Prothrombin Time: 14.2 seconds (ref 11.6–15.2)

## 2016-04-02 LAB — APTT: aPTT: 28 seconds (ref 24–37)

## 2016-04-02 MED ORDER — GELATIN ABSORBABLE 12-7 MM EX MISC
CUTANEOUS | Status: AC
  Filled 2016-04-02: qty 1

## 2016-04-02 MED ORDER — OXYCODONE HCL 5 MG PO TABS
5.0000 mg | ORAL_TABLET | Freq: Once | ORAL | Status: AC
  Administered 2016-04-02: 5 mg via ORAL

## 2016-04-02 MED ORDER — LIDOCAINE HCL (PF) 1 % IJ SOLN
INTRAMUSCULAR | Status: AC
  Filled 2016-04-02: qty 10

## 2016-04-02 MED ORDER — SODIUM CHLORIDE 0.9 % IV SOLN
INTRAVENOUS | Status: AC | PRN
  Administered 2016-04-02: 75 mL/h via INTRAVENOUS

## 2016-04-02 MED ORDER — MIDAZOLAM HCL 2 MG/2ML IJ SOLN
INTRAMUSCULAR | Status: AC
  Filled 2016-04-02: qty 4

## 2016-04-02 MED ORDER — OXYCODONE HCL 5 MG PO TABS
ORAL_TABLET | ORAL | Status: AC
  Administered 2016-04-02: 5 mg via ORAL
  Filled 2016-04-02: qty 1

## 2016-04-02 MED ORDER — FENTANYL CITRATE (PF) 100 MCG/2ML IJ SOLN
INTRAMUSCULAR | Status: AC | PRN
  Administered 2016-04-02 (×2): 25 ug via INTRAVENOUS

## 2016-04-02 MED ORDER — FENTANYL CITRATE (PF) 100 MCG/2ML IJ SOLN
INTRAMUSCULAR | Status: AC
  Filled 2016-04-02: qty 4

## 2016-04-02 MED ORDER — MIDAZOLAM HCL 2 MG/2ML IJ SOLN
INTRAMUSCULAR | Status: AC | PRN
  Administered 2016-04-02: 0.5 mg via INTRAVENOUS

## 2016-04-02 MED ORDER — LIDOCAINE-EPINEPHRINE 1 %-1:100000 IJ SOLN
INTRAMUSCULAR | Status: AC
  Filled 2016-04-02: qty 1

## 2016-04-02 MED ORDER — SODIUM CHLORIDE 0.9 % IV SOLN: INTRAVENOUS | Status: DC

## 2016-04-02 NOTE — Discharge Instructions (Signed)

## 2016-04-02 NOTE — Procedures (Signed)
Technically successful US guided biopsy of indeterminate lesion in the right lobe of the liver.   EBL: None No immediate complications.   Ronny Bacon, MD Pager #: [phone]
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
## 2016-04-02 NOTE — Sedation Documentation (Signed)
O2 d/c'd 

## 2016-04-02 NOTE — Sedation Documentation (Signed)
Gelfoam inserted

## 2016-04-02 NOTE — Sedation Documentation (Signed)
MD at bedside. Reviewing  MRI images

## 2016-04-02 NOTE — Sedation Documentation (Signed)
O2 2l/Shell

## 2016-04-07 ENCOUNTER — Encounter: Payer: Self-pay | Admitting: *Deleted

## 2016-04-07 NOTE — Progress Notes (Signed)
Oncology Nurse Navigator Documentation  Oncology Nurse Navigator Flowsheets 04/07/2016  Navigator Encounter Type Other  Confirmed Diagnosis Date 04/02/2016  Treatment Phase Pre-Tx/Tx Discussion  Barriers/Navigation Needs Coordination of Care  Interventions Coordination of Care  Coordination of Care Other  Acuity Level 2  Acuity Level 2 Other  Time Spent with Patient 1   Per Dr. Worthy Flank request, I notified pathology to send tissue obtained on 04/02/16 to be sent for PDL 1 testing.

## 2016-04-08 ENCOUNTER — Encounter: Payer: Self-pay | Admitting: Radiation Oncology

## 2016-04-08 ENCOUNTER — Encounter: Payer: Self-pay | Admitting: *Deleted

## 2016-04-08 ENCOUNTER — Ambulatory Visit
Admission: RE | Admit: 2016-04-08 | Discharge: 2016-04-08 | Disposition: A | Discharge status: Home / Self Care | Payer: Medicare Other | Source: Ambulatory Visit | Attending: Radiation Oncology | Admitting: Radiation Oncology

## 2016-04-08 ENCOUNTER — Other Ambulatory Visit: Payer: Self-pay | Admitting: Internal Medicine

## 2016-04-08 VITALS — BP 133/82 | HR 102 | Temp 98.1°F | Ht 70.0 in | Wt 170.4 lb

## 2016-04-08 DIAGNOSIS — C3492 Malignant neoplasm of unspecified part of left bronchus or lung: Secondary | ICD-10-CM

## 2016-04-08 MED ORDER — MORPHINE SULFATE ER 30 MG PO TBCR: 30.0000 mg | EXTENDED_RELEASE_TABLET | Freq: Two times a day (BID) | ORAL | 0 refills | Status: DC
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
## 2016-04-08 NOTE — Progress Notes (Signed)
Please see the Nurse Progress Note in the MD Initial Consult Encounter for this patient. 

## 2016-04-08 NOTE — Progress Notes (Signed)
Oncology Nurse Navigator Documentation  Oncology Nurse Navigator Flowsheets 04/08/2016  Navigator Location [facility]  Navigator Encounter Type Clinic/[facility]  Patient Visit Type RadOnc  Treatment Phase Pre-Tx/Tx Discussion  Barriers/Navigation Needs Coordination of Care  Interventions Coordination of Care  Coordination of Care Other  Acuity Level 2  Acuity Level 2 Other  Time Spent with Patient 30   Dr. Sondra Come updated me that patient is having left sided flank pain and Ultram that Dr. Julien Nordmann prescribe is not working. I updated Dr. Julien Nordmann.  He printed prescription and I gave it to the brother, Laverna Peace his care giver.

## 2016-04-10 ENCOUNTER — Encounter: Payer: Self-pay | Admitting: *Deleted

## 2016-04-10 ENCOUNTER — Telehealth: Payer: Self-pay | Admitting: *Deleted

## 2016-04-10 NOTE — Progress Notes (Signed)
Oncology Nurse Navigator Documentation  Oncology Nurse Navigator Flowsheets 04/10/2016  Navigator Encounter Type Other  Treatment Phase Pre-Tx/Tx Discussion  Barriers/Navigation Needs Coordination of Care  Interventions Coordination of Care  Acuity Level 2  Acuity Level 2 Other  Time Spent with Patient 30   I contacted [facility] regarding testing results.  I was told test went out on 04/08/16.  Will up date Dr. Julien Nordmann

## 2016-04-10 NOTE — Telephone Encounter (Signed)
Oncology Nurse Navigator Documentation  Oncology Nurse Navigator Flowsheets 04/10/2016  Navigator Encounter Type Telephone  Telephone Outgoing Call  Treatment Phase Pre-Tx/Tx Discussion  Barriers/Navigation Needs Education  Education Other  Interventions Education Method  Education Method Verbal  Acuity Level 2  Acuity Level 2 Educational needs  Time Spent with Patient 30   Mr. Deakins's brother called triage and is concerned his brother has not received treatment yet.  I called Jimmy to update.  I listened as he explained his concerns.  I updated him that Dr. Julien Nordmann is waiting on PDL 1 test results.  This test needs to be resulted so Dr. Julien Nordmann can give him the best treatment for the patient.  After a while Mr. Guadron's brother calmed down.  He was thankful for the call.

## 2016-04-13 ENCOUNTER — Encounter: Payer: Self-pay | Admitting: *Deleted

## 2016-04-13 ENCOUNTER — Telehealth: Payer: Self-pay | Admitting: *Deleted

## 2016-04-13 DIAGNOSIS — C3492 Malignant neoplasm of unspecified part of left bronchus or lung: Secondary | ICD-10-CM

## 2016-04-13 NOTE — Telephone Encounter (Signed)
Oncology Nurse Navigator Documentation  Oncology Nurse Navigator Flowsheets 04/13/2016  Navigator Encounter Type Telephone  Telephone Outgoing Call  Treatment Phase Pre-Tx/Tx Discussion  Barriers/Navigation Needs Coordination of Care  Interventions Coordination of Care  Coordination of Care Appts  Acuity Level 2  Acuity Level 2 Assistance expediting appointments  Time Spent with Patient 30   I called pathology dept regarding PDL 1 results.  I updated Dr. Julien Nordmann that results will not be back until 04/15/16.  He requested I reschedule appt.  I did and call Gary Sweeney's brother regarding appt to see Dr. Julien Nordmann on 04/17/16 at 9:00 with labs at 8:45.  I will place POF to notify scheduling of appt

## 2016-04-14 ENCOUNTER — Ambulatory Visit: Payer: Medicare Other | Admitting: Internal Medicine

## 2016-04-14 ENCOUNTER — Other Ambulatory Visit: Payer: Self-pay

## 2016-04-14 DIAGNOSIS — Z515 Encounter for palliative care: Secondary | ICD-10-CM

## 2016-04-14 HISTORY — DX: Encounter for palliative care: Z51.5

## 2016-04-14 MED ORDER — OMEPRAZOLE 40 MG PO CPDR: 40.0000 mg | DELAYED_RELEASE_CAPSULE | Freq: Every day | ORAL | 3 refills | Status: DC

## 2016-04-16 ENCOUNTER — Encounter (HOSPITAL_COMMUNITY): Payer: Self-pay

## 2016-04-17 ENCOUNTER — Encounter: Payer: Self-pay | Admitting: Internal Medicine

## 2016-04-17 ENCOUNTER — Telehealth: Payer: Self-pay | Admitting: Medical Oncology

## 2016-04-17 ENCOUNTER — Other Ambulatory Visit (HOSPITAL_BASED_OUTPATIENT_CLINIC_OR_DEPARTMENT_OTHER): Payer: Medicare Other

## 2016-04-17 ENCOUNTER — Ambulatory Visit (HOSPITAL_BASED_OUTPATIENT_CLINIC_OR_DEPARTMENT_OTHER): Payer: Medicare Other | Admitting: Internal Medicine

## 2016-04-17 ENCOUNTER — Telehealth: Payer: Self-pay | Admitting: Family Medicine

## 2016-04-17 VITALS — BP 135/78 | HR 117 | Temp 98.6°F | Resp 18 | Ht 70.0 in | Wt 169.4 lb

## 2016-04-17 DIAGNOSIS — R531 Weakness: Secondary | ICD-10-CM | POA: Diagnosis not present

## 2016-04-17 DIAGNOSIS — R109 Unspecified abdominal pain: Secondary | ICD-10-CM

## 2016-04-17 DIAGNOSIS — C3412 Malignant neoplasm of upper lobe, left bronchus or lung: Secondary | ICD-10-CM

## 2016-04-17 DIAGNOSIS — C787 Secondary malignant neoplasm of liver and intrahepatic bile duct: Secondary | ICD-10-CM

## 2016-04-17 DIAGNOSIS — C3492 Malignant neoplasm of unspecified part of left bronchus or lung: Secondary | ICD-10-CM

## 2016-04-17 DIAGNOSIS — R918 Other nonspecific abnormal finding of lung field: Secondary | ICD-10-CM

## 2016-04-17 DIAGNOSIS — R5383 Other fatigue: Secondary | ICD-10-CM | POA: Diagnosis not present

## 2016-04-17 LAB — COMPREHENSIVE METABOLIC PANEL
ALT: 17 U/L (ref 0–55)
ANION GAP: 10 meq/L (ref 3–11)
AST: 18 U/L (ref 5–34)
Albumin: 2.9 g/dL — ABNORMAL LOW (ref 3.5–5.0)
Alkaline Phosphatase: 145 U/L (ref 40–150)
BUN: 9.3 mg/dL (ref 7.0–26.0)
CHLORIDE: 105 meq/L (ref 98–109)
CO2: 26 meq/L (ref 22–29)
CREATININE: 1 mg/dL (ref 0.7–1.3)
Calcium: 9.4 mg/dL (ref 8.4–10.4)
EGFR: 74 mL/min/{1.73_m2} — ABNORMAL LOW (ref >90)
GLUCOSE: 160 mg/dL — AB (ref 70–140)
Potassium: 4.4 mEq/L (ref 3.5–5.1)
SODIUM: 141 meq/L (ref 136–145)
TOTAL PROTEIN: 6.6 g/dL (ref 6.4–8.3)
Total Bilirubin: 0.42 mg/dL (ref 0.20–1.20)

## 2016-04-17 LAB — CBC WITH DIFFERENTIAL/PLATELET
BASO%: 0.5 % (ref 0.0–2.0)
Basophils Absolute: 0 10*3/uL (ref 0.0–0.1)
EOS%: 2.7 % (ref 0.0–7.0)
Eosinophils Absolute: 0.2 10*3/uL (ref 0.0–0.5)
HCT: 48.4 % (ref 38.4–49.9)
HGB: 15.8 g/dL (ref 13.0–17.1)
LYMPH%: 35.2 % (ref 14.0–49.0)
MCH: 29 pg (ref 27.2–33.4)
MCHC: 32.5 g/dL (ref 32.0–36.0)
MCV: 89 fL (ref 79.3–98.0)
MONO#: 0.5 10*3/uL (ref 0.1–0.9)
MONO%: 9.1 % (ref 0.0–14.0)
NEUT%: 52.5 % (ref 39.0–75.0)
NEUTROS ABS: 3.1 10*3/uL (ref 1.5–6.5)
Platelets: 209 10*3/uL (ref 140–400)
RBC: 5.44 10*6/uL (ref 4.20–5.82)
RDW: 14.5 % (ref 11.0–14.6)
WBC: 5.9 10*3/uL (ref 4.0–10.3)
lymph#: 2.1 10*3/uL (ref 0.9–3.3)

## 2016-04-17 NOTE — Telephone Encounter (Signed)
Received call from the Onocologist.  Pt was seen there once for Stage III lung cancer.  After long discussion with him and family members, patient has opted for Hospice consult and Pallative care.  They are asking for PCP to initiate referral.  Referral placed.

## 2016-04-17 NOTE — Telephone Encounter (Signed)
Agree with above 

## 2016-04-17 NOTE — Telephone Encounter (Signed)
Per Julien Nordmann I called PCP, spoke to Maudie Mercury , and told her pt wants hospice, palliative care and to please have Dr Doren Custard refer pt .
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
## 2016-04-20 ENCOUNTER — Telehealth: Payer: Self-pay | Admitting: *Deleted

## 2016-04-20 NOTE — Telephone Encounter (Signed)
Received call from [place] with [facility].   States that patient has requested Hospice eval.   Appointment for evaluation and admission is scheduled for 04/22/2016.  PCP to be made aware.

## 2016-04-20 NOTE — Telephone Encounter (Signed)
Oncology Nurse Navigator Documentation  Oncology Nurse Navigator Flowsheets 04/20/2016  Navigator Encounter Type Telephone  Telephone Outgoing Call  Treatment Phase Other  Barriers/Navigation Needs Coordination of Care  Interventions Coordination of Care  Coordination of Care Hospice/Palliative Care  Acuity Level 2  Acuity Level 2 Other  Time Spent with Patient 30   I noticed that Gary Sweeney has been referred to hospice. I called to check on him.  I spoke with his care giver, his brother Gary Sweeney.  I listened as he explained how his brother was doing and that the discussion with Dr. Julien Nordmann was Hospice. I asked if he had notified PCP and he stated not yet.  I gave him the phone number for Dena Billet PA c to follow up with her about Hospice referral.  I asked that he call me back if needed.  He was thankful for the help.

## 2016-05-08 ENCOUNTER — Other Ambulatory Visit: Payer: Self-pay | Admitting: Family Medicine

## 2016-05-08 NOTE — Telephone Encounter (Signed)
Refill appropriate and filled per protocol. 

## 2016-07-07 ENCOUNTER — Inpatient Hospital Stay
Admission: EM | Admit: 2016-07-07 | Discharge: 2016-07-09 | DRG: 189 | Disposition: A | Discharge status: Hospice | Payer: Medicare Other | Attending: Internal Medicine | Admitting: Internal Medicine

## 2016-07-07 ENCOUNTER — Emergency Department: Payer: Medicare Other

## 2016-07-07 ENCOUNTER — Encounter: Payer: Self-pay | Admitting: Emergency Medicine

## 2016-07-07 DIAGNOSIS — Z86010 Personal history of colonic polyps: Secondary | ICD-10-CM

## 2016-07-07 DIAGNOSIS — F1026 Alcohol dependence with alcohol-induced persisting amnestic disorder: Secondary | ICD-10-CM | POA: Diagnosis present

## 2016-07-07 DIAGNOSIS — F411 Generalized anxiety disorder: Secondary | ICD-10-CM

## 2016-07-07 DIAGNOSIS — G8929 Other chronic pain: Secondary | ICD-10-CM | POA: Diagnosis present

## 2016-07-07 DIAGNOSIS — K219 Gastro-esophageal reflux disease without esophagitis: Secondary | ICD-10-CM | POA: Diagnosis present

## 2016-07-07 DIAGNOSIS — R1084 Generalized abdominal pain: Secondary | ICD-10-CM

## 2016-07-07 DIAGNOSIS — Z800 Family history of malignant neoplasm of digestive organs: Secondary | ICD-10-CM

## 2016-07-07 DIAGNOSIS — Z87442 Personal history of urinary calculi: Secondary | ICD-10-CM

## 2016-07-07 DIAGNOSIS — R778 Other specified abnormalities of plasma proteins: Secondary | ICD-10-CM | POA: Diagnosis present

## 2016-07-07 DIAGNOSIS — L8990 Pressure ulcer of unspecified site, unspecified stage: Secondary | ICD-10-CM | POA: Insufficient documentation

## 2016-07-07 DIAGNOSIS — Z87891 Personal history of nicotine dependence: Secondary | ICD-10-CM

## 2016-07-07 DIAGNOSIS — K861 Other chronic pancreatitis: Secondary | ICD-10-CM | POA: Diagnosis present

## 2016-07-07 DIAGNOSIS — Z8042 Family history of malignant neoplasm of prostate: Secondary | ICD-10-CM

## 2016-07-07 DIAGNOSIS — Z8249 Family history of ischemic heart disease and other diseases of the circulatory system: Secondary | ICD-10-CM

## 2016-07-07 DIAGNOSIS — J918 Pleural effusion in other conditions classified elsewhere: Secondary | ICD-10-CM | POA: Diagnosis present

## 2016-07-07 DIAGNOSIS — J9601 Acute respiratory failure with hypoxia: Secondary | ICD-10-CM | POA: Diagnosis not present

## 2016-07-07 DIAGNOSIS — Z66 Do not resuscitate: Secondary | ICD-10-CM | POA: Diagnosis present

## 2016-07-07 DIAGNOSIS — C787 Secondary malignant neoplasm of liver and intrahepatic bile duct: Secondary | ICD-10-CM | POA: Diagnosis present

## 2016-07-07 DIAGNOSIS — Z79891 Long term (current) use of opiate analgesic: Secondary | ICD-10-CM

## 2016-07-07 DIAGNOSIS — N179 Acute kidney failure, unspecified: Secondary | ICD-10-CM | POA: Diagnosis present

## 2016-07-07 DIAGNOSIS — Z923 Personal history of irradiation: Secondary | ICD-10-CM

## 2016-07-07 DIAGNOSIS — Z79899 Other long term (current) drug therapy: Secondary | ICD-10-CM

## 2016-07-07 DIAGNOSIS — C3492 Malignant neoplasm of unspecified part of left bronchus or lung: Secondary | ICD-10-CM | POA: Diagnosis present

## 2016-07-07 DIAGNOSIS — R0902 Hypoxemia: Secondary | ICD-10-CM | POA: Diagnosis present

## 2016-07-07 DIAGNOSIS — R627 Adult failure to thrive: Secondary | ICD-10-CM | POA: Diagnosis present

## 2016-07-07 DIAGNOSIS — Z515 Encounter for palliative care: Secondary | ICD-10-CM

## 2016-07-07 DIAGNOSIS — E860 Dehydration: Secondary | ICD-10-CM | POA: Diagnosis present

## 2016-07-07 DIAGNOSIS — Z8546 Personal history of malignant neoplasm of prostate: Secondary | ICD-10-CM

## 2016-07-07 HISTORY — DX: Encounter for palliative care: Z51.5

## 2016-07-07 LAB — CBC WITH DIFFERENTIAL/PLATELET
BASOS ABS: 0 10*3/uL (ref 0–0.1)
Basophils Relative: 0 %
EOS PCT: 0 %
Eosinophils Absolute: 0 10*3/uL (ref 0–0.7)
HEMATOCRIT: 39.1 % — AB (ref 40.0–52.0)
Hemoglobin: 12.8 g/dL — ABNORMAL LOW (ref 13.0–18.0)
Lymphocytes Relative: 4 %
Lymphs Abs: 0.6 10*3/uL — ABNORMAL LOW (ref 1.0–3.6)
MCH: 28.9 pg (ref 26.0–34.0)
MCHC: 32.7 g/dL (ref 32.0–36.0)
MCV: 88.4 fL (ref 80.0–100.0)
MONO ABS: 0.1 10*3/uL — AB (ref 0.2–1.0)
MONOS PCT: 1 %
NEUTROS PCT: 95 %
Neutro Abs: 13.5 10*3/uL — ABNORMAL HIGH (ref 1.4–6.5)
PLATELETS: 159 10*3/uL (ref 150–440)
RBC: 4.43 MIL/uL (ref 4.40–5.90)
RDW: 16.1 % — AB (ref 11.5–14.5)
WBC: 14.2 10*3/uL — AB (ref 3.8–10.6)

## 2016-07-07 LAB — COMPREHENSIVE METABOLIC PANEL
ALBUMIN: 1.9 g/dL — AB (ref 3.5–5.0)
ALT: 65 U/L — AB (ref 17–63)
AST: 66 U/L — AB (ref 15–41)
Alkaline Phosphatase: 91 U/L (ref 38–126)
Anion gap: 10 (ref 5–15)
BUN: 33 mg/dL — AB (ref 6–20)
CHLORIDE: 106 mmol/L (ref 101–111)
CO2: 22 mmol/L (ref 22–32)
CREATININE: 1.3 mg/dL — AB (ref 0.61–1.24)
Calcium: 7.4 mg/dL — ABNORMAL LOW (ref 8.9–10.3)
GFR calc Af Amer: >60 mL/min (ref >60)
GFR, EST NON AFRICAN AMERICAN: 52 mL/min — AB (ref >60)
GLUCOSE: 225 mg/dL — AB (ref 65–99)
POTASSIUM: 4.8 mmol/L (ref 3.5–5.1)
Sodium: 138 mmol/L (ref 135–145)
Total Bilirubin: 0.9 mg/dL (ref 0.3–1.2)
Total Protein: 4.6 g/dL — ABNORMAL LOW (ref 6.5–8.1)

## 2016-07-07 LAB — TROPONIN I: Troponin I: 0.07 ng/mL (ref <0.03)

## 2016-07-07 MED ORDER — SODIUM CHLORIDE 0.9 % IV BOLUS (SEPSIS)
1000.0000 mL | Freq: Once | INTRAVENOUS | Status: AC
  Administered 2016-07-07: 1000 mL via INTRAVENOUS

## 2016-07-07 MED ORDER — FENTANYL CITRATE (PF) 100 MCG/2ML IJ SOLN
INTRAMUSCULAR | Status: AC
Start: 2016-07-07 — End: 2016-07-08
  Filled 2016-07-07: qty 2

## 2016-07-07 MED ORDER — FENTANYL CITRATE (PF) 100 MCG/2ML IJ SOLN
50.0000 ug | Freq: Once | INTRAMUSCULAR | Status: AC
  Administered 2016-07-07: 50 ug via INTRAVENOUS

## 2016-07-07 NOTE — Progress Notes (Signed)
Called to room for emergency traffic. EMS has pt on CPAP. Dr. Darl Householder wants pt on NRB mask for now. Pt placed on NRB mask.

## 2016-07-07 NOTE — ED Triage Notes (Signed)
Pt coming from home hospice for lung and liver cancer with AMS with O2 sat of 70% on 2l [place].
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
## 2016-07-07 NOTE — ED Notes (Signed)
Critical troponin 0.07 MD made aware
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
## 2016-07-08 DIAGNOSIS — R1084 Generalized abdominal pain: Secondary | ICD-10-CM

## 2016-07-08 DIAGNOSIS — Z515 Encounter for palliative care: Secondary | ICD-10-CM | POA: Diagnosis not present

## 2016-07-08 DIAGNOSIS — F411 Generalized anxiety disorder: Secondary | ICD-10-CM

## 2016-07-08 DIAGNOSIS — L8990 Pressure ulcer of unspecified site, unspecified stage: Secondary | ICD-10-CM | POA: Insufficient documentation

## 2016-07-08 MED ORDER — BIOTENE DRY MOUTH MT LIQD: 15.0000 mL | OROMUCOSAL | Status: DC | PRN

## 2016-07-08 MED ORDER — ALPRAZOLAM 1 MG PO TABS: 1.0000 mg | ORAL_TABLET | ORAL | Status: DC | PRN

## 2016-07-08 MED ORDER — LORAZEPAM 2 MG/ML IJ SOLN
1.0000 mg | INTRAMUSCULAR | Status: DC | PRN
  Administered 2016-07-08 – 2016-07-09 (×2): 1 mg via INTRAVENOUS
  Filled 2016-07-08 (×2): qty 1

## 2016-07-08 MED ORDER — GABAPENTIN 300 MG PO CAPS
300.0000 mg | ORAL_CAPSULE | Freq: Two times a day (BID) | ORAL | Status: DC
  Administered 2016-07-08 (×2): 300 mg via ORAL
  Filled 2016-07-08 (×2): qty 1

## 2016-07-08 MED ORDER — HALOPERIDOL LACTATE 5 MG/ML IJ SOLN
0.5000 mg | INTRAMUSCULAR | Status: DC | PRN
  Administered 2016-07-09: 0.5 mg via INTRAVENOUS
  Filled 2016-07-08: qty 1

## 2016-07-08 MED ORDER — ONDANSETRON HCL 4 MG/2ML IJ SOLN: 4.0000 mg | Freq: Four times a day (QID) | INTRAMUSCULAR | Status: DC | PRN

## 2016-07-08 MED ORDER — PANTOPRAZOLE SODIUM 40 MG PO TBEC
40.0000 mg | DELAYED_RELEASE_TABLET | Freq: Every day | ORAL | Status: DC
  Administered 2016-07-08: 40 mg via ORAL
  Filled 2016-07-08: qty 1

## 2016-07-08 MED ORDER — HALOPERIDOL 0.5 MG PO TABS: 0.5000 mg | ORAL_TABLET | ORAL | Status: DC | PRN

## 2016-07-08 MED ORDER — HALOPERIDOL LACTATE 2 MG/ML PO CONC
0.5000 mg | ORAL | Status: DC | PRN
Start: 2016-07-08 — End: 2016-07-09
  Filled 2016-07-08: qty 0.3

## 2016-07-08 MED ORDER — MORPHINE SULFATE (PF) 2 MG/ML IV SOLN
2.0000 mg | INTRAVENOUS | Status: DC | PRN
  Administered 2016-07-08 (×2): 2 mg via INTRAVENOUS
  Filled 2016-07-08 (×2): qty 1

## 2016-07-08 MED ORDER — MIRTAZAPINE 15 MG PO TABS
30.0000 mg | ORAL_TABLET | Freq: Every evening | ORAL | Status: DC | PRN
  Administered 2016-07-08: 30 mg via ORAL
  Filled 2016-07-08: qty 2

## 2016-07-08 MED ORDER — GLYCOPYRROLATE 0.2 MG/ML IJ SOLN: 0.2000 mg | INTRAMUSCULAR | Status: DC | PRN

## 2016-07-08 MED ORDER — ENSURE ENLIVE PO LIQD: 237.0000 mL | Freq: Two times a day (BID) | ORAL | Status: DC

## 2016-07-08 MED ORDER — MORPHINE SULFATE 15 MG PO TABS
15.0000 mg | ORAL_TABLET | ORAL | Status: DC | PRN
  Administered 2016-07-08: 15 mg via ORAL
  Filled 2016-07-08: qty 1

## 2016-07-08 MED ORDER — ONDANSETRON 8 MG PO TBDP: 4.0000 mg | ORAL_TABLET | Freq: Four times a day (QID) | ORAL | Status: DC | PRN

## 2016-07-08 MED ORDER — GLYCOPYRROLATE 0.2 MG/ML IJ SOLN
0.2000 mg | INTRAMUSCULAR | Status: DC | PRN
  Administered 2016-07-09: 0.2 mg via INTRAVENOUS
  Filled 2016-07-08 (×2): qty 1

## 2016-07-08 MED ORDER — SENNA 8.6 MG PO TABS
1.0000 | ORAL_TABLET | Freq: Every day | ORAL | Status: DC
  Administered 2016-07-08: 8.6 mg via ORAL
  Filled 2016-07-08: qty 1

## 2016-07-08 MED ORDER — POLYVINYL ALCOHOL 1.4 % OP SOLN
1.0000 [drp] | Freq: Four times a day (QID) | OPHTHALMIC | Status: DC | PRN
  Filled 2016-07-08: qty 15

## 2016-07-08 MED ORDER — ORAL CARE MOUTH RINSE
15.0000 mL | Freq: Two times a day (BID) | OROMUCOSAL | Status: DC
  Administered 2016-07-08 – 2016-07-09 (×2): 15 mL via OROMUCOSAL

## 2016-07-08 MED ORDER — MORPHINE SULFATE ER 30 MG PO TBCR
60.0000 mg | EXTENDED_RELEASE_TABLET | Freq: Two times a day (BID) | ORAL | Status: DC
  Administered 2016-07-08 (×2): 60 mg via ORAL
  Filled 2016-07-08 (×2): qty 2

## 2016-07-08 MED ORDER — GLYCOPYRROLATE 1 MG PO TABS
1.0000 mg | ORAL_TABLET | ORAL | Status: DC | PRN
  Administered 2016-07-08: 1 mg via ORAL
  Filled 2016-07-08 (×2): qty 1

## 2016-07-08 MED ORDER — MORPHINE SULFATE (PF) 2 MG/ML IV SOLN
2.0000 mg | INTRAVENOUS | Status: DC | PRN
  Administered 2016-07-08 – 2016-07-09 (×4): 2 mg via INTRAVENOUS
  Filled 2016-07-08 (×4): qty 1

## 2016-07-08 NOTE — Progress Notes (Signed)
Nutrition Brief Note  75 year old male with known metastatic cancer and who is already on hospice. Presents with hypoxia and difficulty breathing. Family is requesting comfort measures for patient.  Patient screened to be seen by RD due to Low Braden score.  Patient is now on regular diet and had some oatmeal this morning. He reports he normally drinks Ensure at home and would like it here. RD will order for patient.  No further nutrition interventions warranted at this time.  Please consult Dietitian as needed.   Willey Blade, MS, RD, LDN Pager: [phone] After Hours Pager: [phone]

## 2016-07-08 NOTE — Plan of Care (Signed)
Problem: Safety: Goal: Ability to remain free from injury will improve Outcome: Progressing Pt comfort care measures only at this time. Pt reporting 9/10 abd pain. Pt received IV and SR morphine and sleeping at this time. Palliative care assessed today, family at the bedside with no additional questions or concerns at this time. Plan to reassess need for hospice in tomorrow. Will continue to monitor.

## 2016-07-08 NOTE — Plan of Care (Signed)
Problem: Education: Goal: Knowledge of [facility] information/materials will improve Outcome: Progressing VS WDL, free of falls during shift.  Pt's daughter oriented to unit, including call bell, bed alarm.  Pt drowsy for majority of shift, unable to complete admission profile, daughter unsure of many answers.  Transitioned to [employer], SpO2 95%.  Reported pain 7/10, Dr. Marcille Blanco paged for IV pain meds, pt unable to remain alert to take PO.  Received IV Morphine '2mg'$ , asleep on reassessment.  No other complaints overnight.  Daughter at bedside, call bell within reach.  WCTM.  Problem: Skin Integrity: Goal: Risk for impaired skin integrity will decrease Outcome: Not Progressing Unstageable pressure injury on L heel, MSAD buttocks

## 2016-07-08 NOTE — Progress Notes (Addendum)
[place] at [place] NAME: Gary Sweeney    MR#:  [id number]  DATE OF BIRTH:  1941-03-11  SUBJECTIVE:  CHIEF COMPLAINT:   Chief Complaint  Patient presents with  . Shortness of Breath   Patient is more awake, on O2 [place] 4L. REVIEW OF SYSTEMS:  Review of Systems  Unable to perform ROS: Medical condition    DRUG ALLERGIES:   Allergies  Allergen Reactions  . Codeine Other (See Comments)    Reaction: unknown  . Tylenol [Acetaminophen] Itching   VITALS:  Blood pressure 104/75, pulse 94, temperature 98 F (36.7 C), temperature source Oral, resp. rate 16, height '5\' 11"'$  (1.803 m), weight 164 lb (74.4 kg), SpO2 91 %. PHYSICAL EXAMINATION:  Physical Exam  Constitutional: He is oriented to person, place, and time.  Lethargy and fragile.   HENT:  Head: Normocephalic.  Mouth/Throat: Oropharynx is clear and moist.  Eyes: Conjunctivae and EOM are normal. No scleral icterus.  Neck: No JVD present.  Cardiovascular: Normal rate, regular rhythm and normal heart sounds.  Exam reveals no gallop.   No murmur heard. Pulmonary/Chest: He is in respiratory distress. He has no wheezes. He has rales.  Crackle on both side.  Abdominal: Soft. Bowel sounds are normal. He exhibits no distension. There is no tenderness.  Musculoskeletal: He exhibits no edema.  Lymphadenopathy:    He has cervical adenopathy.  Neurological: He is alert and oriented to person, place, and time.  Skin: No rash noted. No erythema.   LABORATORY PANEL:   CBC  Recent Labs Lab 07/07/16 2158  WBC 14.2*  HGB 12.8*  HCT 39.1*  PLT 159   ------------------------------------------------------------------------------------------------------------------ Chemistries   Recent Labs Lab 07/07/16 2158  NA 138  K 4.8  CL 106  CO2 22  GLUCOSE 225*  BUN 33*  CREATININE 1.30*  CALCIUM 7.4*  AST 66*  ALT 65*  ALKPHOS 91  BILITOT 0.9   RADIOLOGY:  Dg Chest Port 1  View  Result Date: 07/07/2016 CLINICAL DATA:  Dyspnea EXAM: PORTABLE CHEST 1 VIEW COMPARISON:  03/11/2016 FINDINGS: There is volume loss noted of the left hemithorax. There has been development of atelectasis with moderate left effusion obscuring the left heart border, aortic arch and diaphragm. Superimposed pneumonic consolidations of the left lung are not excluded. No overt pulmonary edema. Faint linear areas of streaky parenchymal opacity consistent fibrosis suggested at the right lung base. No suspicious osseous abnormality. Aortic arch atherosclerosis. IMPRESSION: Volume loss in the left lung with moderate effusion obscuring the left hemidiaphragm, heart border and mediastinum. Probable compressive atelectasis secondary to the effusion. Superimposed pneumonic consolidations are not entirely excluded the left. Electronically Signed   By: Ashley Royalty M.D.   On: 07/07/2016 22:11   ASSESSMENT AND PLAN:   Acute respiratory failure with hypoxia - possible due to pneumonia versus pleural effusion Family does not wish for any aggressive treatment, including antibiotics. They've opted for comfort measures. Continue O2 [place] 4 L.   Elevated troponin, due to above.  ARF due to dehydration.    Stage IV squamous cell carcinoma of left lung ([place]) - very poor prognosis Pain control, supportive care, possible discharge home with hospice care or hospice home tomorrow per palliative consult.  I discussed with palliative team.   All the records are reviewed and case discussed with Care Management/Social Worker. Management plans discussed with the patient, family and they are in agreement.  CODE STATUS: DNR.  TOTAL TIME TAKING CARE  OF THIS PATIENT: 38 minutes.   More than 50% of the time was spent in counseling/coordination of care: YES  POSSIBLE D/C IN 1-2 DAYS, DEPENDING ON CLINICAL CONDITION.   Demetrios Loll M.D on 07/08/2016 at 1:33 PM  Between 7am to 6pm - Pager - [phone]  After 6pm go to  [email or web address] - Proofreader  [facility]  Office  [phone]  CC: Primary care physician; Karis Juba, PA-C  Note: This dictation was prepared with Dragon dictation along with smaller phrase technology. Any transcriptional errors that result from this process are unintentional.

## 2016-07-08 NOTE — Care Management Obs Status (Signed)
[facility] NOTIFICATION   Patient Details  Name: Gary Sweeney MRN: [id number] Date of Birth: 02/05/1941   Medicare Observation Status Notification Given:  Yes  Daughter signed    Beau Fanny, RN 07/08/2016, 8:55 AM

## 2016-07-08 NOTE — Consult Note (Signed)
Consultation Note Date: 07/08/2016   Patient Name: Gary Sweeney  DOB: [birth year]  MRN: [id number]  Age / Sex: 75 y.o., male  PCP: Orlena Sheldon, PA-C Referring Physician: Demetrios Loll, MD  Reason for Consultation: Establishing goals of care, Hospice Evaluation and Terminal Care  HPI/Patient Profile: 75 y.o. male  with past medical history of lung cancer with metastatic liver lesions, prostate cancer, anxiety, osteoarthritis, GERD, diverticulosis, chronic pancreatitis, and alcoholism admitted on 07/07/2016 with shortness of breath and hypoxia. Initially placed on CPAP and then NRB mask. He is a home hospice patient and family requests for comfort measures only. Palliative medicine consultation for hospice evaluation.   Clinical Assessment and Goals of Care: Wadie Lessen, NP and I met with patient, daughter, and granddaughter at bedside. Also spoke with Laverna Peace, brother and POA via telephone. Patient has had [facility] services at home. He lives with his brother, Laverna Peace. Mr. Drost tells me that he is no longer able to get out of bed, does not have an appetite, and has pain frequently. Laverna Peace tells [place] that is has become more challenging to care for the patient at home and that he needs 24 hour assistance. Daughter, Stanton Kidney tells [place] that her Ripley Fraise is exhausted and it has become harder to care for the patient at home. Spoke with staff from [facility] who tell me the patient has declined and their social workers have been trying to work with family about getting him to a skilled facility with hospice.   Patient tells [place] he feels that he is dying and is at peace with this. He just wants to be pain free and comfortable. Discussed with patient and family the focus of comfort measures only, symptom management, and natural trajectory and expectations at end-of-life. Patient complaining of intense, generalized  abdominal pain. Medication adjustments made. Discussed with patient and family that he may be residential hospice eligible. Will monitor status the next 24 hours and make recommendation tomorrow if patient continues to decline. Offered emotional support.   HCPOA-brother (Jimmy)   SUMMARY OF RECOMMENDATIONS    DNR/DNI  Comfort measures only. Discontinued medications, labs, and interventions not aimed at comfort. PO medications (neurontin, remeron, xanax) if patient awake/alert enough to take or wanting to take.    Symptom management--see below.   [place] home with hospice services vs. Residential hospice facility which patient may be eligible for if he continues to decline.   Focus is pain/symptom management for next 24 hours. PMT will follow-up tomorrow, 10/26 regarding disposition.    Code Status/Advance Care Planning:  DNR   Symptom Management:   Morphine 37m IV q1h prn pain/dyspnea. Continue scheduled MS Contin 12hr tablet 627m   Robinul 0.41m35mV q4h prn secretions.  Ativan 1mg47m q4h prn anxiety/agitation.  Palliative Prophylaxis:   Aspiration, Bowel Regimen, Delirium Protocol, Frequent Pain Assessment and Oral Care  Additional Recommendations (Limitations, Scope, Preferences):  Full Comfort Care  Psycho-social/Spiritual:   Desire for further Chaplaincy support:no  Additional Recommendations: Caregiving  Support/Resources and Education on  Hospice  Prognosis:   < 2 weeks in the setting of metastatic lung cancer, functional and nutritional status decline, and failure to thrive.  Discharge Planning: To Be Determined home hospice vs. Residential hospice facility     Primary Diagnoses: Present on Admission: . Stage IV squamous cell carcinoma of left lung ([place]) . Hypoxia   I have reviewed the medical record, interviewed the patient and family, and examined the patient. The following aspects are pertinent.  Past Medical History:  Diagnosis Date  .  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 Chronic abdominal pain   . Chronic headache   . Chronic pancreatitis ([place])    suspected at [facility]  . Diverticulosis   . DJD (degenerative joint disease), lumbar   . GERD (gastroesophageal reflux disease)   . Hospice care 04/2016  . Hx of adenomatous colonic polyps   . Internal hemorrhoid   . Lung mass 03/12/2016  . Nephrolithiasis   . Osteoarthritis   . Panic anxiety syndrome   . Prostate cancer [facility])    s/p xrt  . Shortness of breath dyspnea   . Stage III squamous cell carcinoma of left lung ([place]) 03/20/2016  . Syncope    states he blacks out occasionally   Social History   Social History  . Marital status: Divorced    Spouse name: N/A  . Number of children: 6  . Years of education: N/A   Occupational History  . unemployed     Former Games developer   Social History Main Topics  . Smoking status: Former Smoker    Packs/day: 1.00    Years: 50.00    Types: Cigarettes    Quit date: 07/25/2013  . Smokeless tobacco: Never Used  . Alcohol use No  . Drug use: No     Comment: marijuana  . Sexual activity: No   Other Topics Concern  . None   Social History Narrative  . None   Family History  Problem Relation Age of Onset  . Colon cancer Mother   . Heart attack Mother     and Father  . Alcohol abuse Mother   . Prostate cancer Father   . Heart disease Father     MI  . Colon cancer Father    Scheduled Meds: . feeding supplement (ENSURE ENLIVE)  237 mL Oral BID BM  . gabapentin  300 mg Oral BID  . mouth rinse  15 mL Mouth Rinse BID  . morphine  60 mg Oral Q12H  . senna  1 tablet Oral Daily   Continuous Infusions:  PRN Meds:.ALPRAZolam, antiseptic oral rinse, [DISCONTINUED] glycopyrrolate **OR** [DISCONTINUED] glycopyrrolate **OR** glycopyrrolate, haloperidol **OR** haloperidol **OR** haloperidol lactate, LORazepam, mirtazapine, morphine injection, ondansetron **OR** ondansetron (ZOFRAN) IV, polyvinyl alcohol Medications  Prior to Admission:  Prior to Admission medications   Medication Sig Start Date End Date Taking? Authorizing Provider  ALPRAZolam Duanne Moron) 1 MG tablet Take 1 tablet by mouth every 4 (four) hours as needed for anxiety.  08/21/15  Yes Historical Provider, MD  celecoxib (CELEBREX) 200 MG capsule TAKE ONE CAPSULE BY MOUTH DAILY 05/08/16  Yes Orlena Sheldon, PA-C  dexamethasone (DECADRON) 2 MG tablet Take 2 mg by mouth 2 (two) times daily with a meal.   Yes Historical Provider, MD  gabapentin (NEURONTIN) 300 MG capsule Take 300 mg by mouth 2 (two) times daily.   Yes Historical Provider, MD  mirtazapine (REMERON) 30 MG tablet Take 1 tablet by mouth at bedtime as needed (sleep).  02/18/16  Yes Historical Provider, MD  morphine (MS CONTIN) 60 MG 12 hr tablet Take 60 mg by mouth every 12 (twelve) hours.   Yes Historical Provider, MD  morphine (MSIR) 15 MG tablet Take 15 mg by mouth every 4 (four) hours as needed for severe pain.   Yes Historical Provider, MD  omeprazole (PRILOSEC) 40 MG capsule Take 1 capsule (40 mg total) by mouth daily. 04/14/16  Yes Gatha Mayer, MD  senna (SENOKOT) 8.6 MG TABS tablet Take 1 tablet by mouth daily.   Yes Historical Provider, MD   Allergies  Allergen Reactions  . Codeine Other (See Comments)    Reaction: unknown  . Tylenol [Acetaminophen] Itching   Review of Systems  Constitutional: Positive for activity change, appetite change and fatigue.  HENT:       Secretions  Respiratory: Positive for shortness of breath.   Gastrointestinal: Positive for abdominal pain.  Neurological: Positive for weakness.    Physical Exam  Constitutional: He is oriented to person, place, and time. He is cooperative.  Cardiovascular: Regular rhythm and normal heart sounds.   Pulmonary/Chest: No accessory muscle usage. Tachypnea noted. He has rhonchi.  Abdominal: He exhibits distension. Bowel sounds are decreased. There is generalized tenderness and tenderness in the right upper quadrant.    Musculoskeletal: He exhibits edema (RUE ).  Neurological: He is alert and oriented to person, place, and time.  Skin: Skin is warm and dry.  Psychiatric: His behavior is normal. His mood appears anxious. His speech is delayed.  Nursing note and vitals reviewed.  Vital Signs: BP 104/75 (BP Location: Right Arm)   Pulse 94   Temp 98 F (36.7 C) (Oral)   Resp 16   Ht 5' 11"  (1.803 m)   Wt 74.4 kg (164 lb)   SpO2 91%   BMI 22.87 kg/m  Pain Assessment: 0-10   Pain Score: Asleep  SpO2: SpO2: 91 % O2 Device:SpO2: 91 % O2 Flow Rate: .O2 Flow Rate (L/min): 4 L/min  IO: Intake/output summary:   Intake/Output Summary (Last 24 hours) at 07/08/16 1358 Last data filed at 07/08/16 0800  Gross per 24 hour  Intake             1300 ml  Output              175 ml  Net             1125 ml    LBM: Last BM Date: 07/06/16 Baseline Weight: Weight: 74.7 kg (164 lb 9.6 oz) Most recent weight: Weight: 74.4 kg (164 lb)     Palliative Assessment/Data: PPS 20%   Flowsheet Rows   Flowsheet Row Most Recent Value  Intake Tab  Referral Department  Oncology  Unit at Time of Referral  Med/Surg Unit  Palliative Care Primary Diagnosis  Cancer  Date Notified  07/07/16  Palliative Care Type  New Palliative care  Reason for referral  Clarify Goals of Care, Counsel Regarding Hospice, End of [place], Pain  Date of Admission  07/07/16  Date first seen by Palliative Care  07/08/16  # of days Palliative referral response time  1 Day(s)  # of days IP prior to Palliative referral  0  Clinical Assessment  Palliative Performance Scale Score  20%  Psychosocial & Spiritual Assessment  Palliative Care Outcomes  Patient/Family meeting held?  Yes  Who was at the meeting?  daughter and granddaughter  Palliative Care Outcomes  Clarified goals of care, Provided end of  life care assistance, Provided psychosocial or spiritual support, Counseled regarding hospice      Time In: 0940 Time Out:  1050 Time Total: 81mn Greater than 50%  of this time was spent counseling and coordinating care related to the above assessment and plan.  Signed by:  MIhor Dow FNP-C Palliative Medicine Team  Phone: [phone]: [phone] Please contact Palliative Medicine Team phone at [phone] questions and concerns.  For individual provider: See AShea Evans

## 2016-07-08 NOTE — ED Notes (Signed)
Transported patient to room 122-1C

## 2016-07-09 DIAGNOSIS — Z79891 Long term (current) use of opiate analgesic: Secondary | ICD-10-CM | POA: Diagnosis not present

## 2016-07-09 DIAGNOSIS — Z923 Personal history of irradiation: Secondary | ICD-10-CM | POA: Diagnosis not present

## 2016-07-09 DIAGNOSIS — J918 Pleural effusion in other conditions classified elsewhere: Secondary | ICD-10-CM | POA: Diagnosis present

## 2016-07-09 DIAGNOSIS — Z515 Encounter for palliative care: Secondary | ICD-10-CM | POA: Diagnosis present

## 2016-07-09 DIAGNOSIS — F411 Generalized anxiety disorder: Secondary | ICD-10-CM | POA: Diagnosis not present

## 2016-07-09 DIAGNOSIS — Z8249 Family history of ischemic heart disease and other diseases of the circulatory system: Secondary | ICD-10-CM | POA: Diagnosis not present

## 2016-07-09 DIAGNOSIS — K219 Gastro-esophageal reflux disease without esophagitis: Secondary | ICD-10-CM | POA: Diagnosis present

## 2016-07-09 DIAGNOSIS — Z66 Do not resuscitate: Secondary | ICD-10-CM | POA: Diagnosis present

## 2016-07-09 DIAGNOSIS — J9601 Acute respiratory failure with hypoxia: Secondary | ICD-10-CM | POA: Diagnosis present

## 2016-07-09 DIAGNOSIS — C3492 Malignant neoplasm of unspecified part of left bronchus or lung: Secondary | ICD-10-CM | POA: Diagnosis present

## 2016-07-09 DIAGNOSIS — R778 Other specified abnormalities of plasma proteins: Secondary | ICD-10-CM | POA: Diagnosis present

## 2016-07-09 DIAGNOSIS — Z800 Family history of malignant neoplasm of digestive organs: Secondary | ICD-10-CM | POA: Diagnosis not present

## 2016-07-09 DIAGNOSIS — K861 Other chronic pancreatitis: Secondary | ICD-10-CM | POA: Diagnosis present

## 2016-07-09 DIAGNOSIS — Z87891 Personal history of nicotine dependence: Secondary | ICD-10-CM | POA: Diagnosis not present

## 2016-07-09 DIAGNOSIS — E860 Dehydration: Secondary | ICD-10-CM | POA: Diagnosis present

## 2016-07-09 DIAGNOSIS — R1084 Generalized abdominal pain: Secondary | ICD-10-CM | POA: Diagnosis not present

## 2016-07-09 DIAGNOSIS — Z86010 Personal history of colonic polyps: Secondary | ICD-10-CM | POA: Diagnosis not present

## 2016-07-09 DIAGNOSIS — Z8546 Personal history of malignant neoplasm of prostate: Secondary | ICD-10-CM | POA: Diagnosis not present

## 2016-07-09 DIAGNOSIS — Z8042 Family history of malignant neoplasm of prostate: Secondary | ICD-10-CM | POA: Diagnosis not present

## 2016-07-09 DIAGNOSIS — Z79899 Other long term (current) drug therapy: Secondary | ICD-10-CM | POA: Diagnosis not present

## 2016-07-09 DIAGNOSIS — G8929 Other chronic pain: Secondary | ICD-10-CM | POA: Diagnosis present

## 2016-07-09 DIAGNOSIS — N179 Acute kidney failure, unspecified: Secondary | ICD-10-CM | POA: Diagnosis present

## 2016-07-09 DIAGNOSIS — F1026 Alcohol dependence with alcohol-induced persisting amnestic disorder: Secondary | ICD-10-CM | POA: Diagnosis present

## 2016-07-09 DIAGNOSIS — Z87442 Personal history of urinary calculi: Secondary | ICD-10-CM | POA: Diagnosis not present

## 2016-07-09 DIAGNOSIS — R627 Adult failure to thrive: Secondary | ICD-10-CM | POA: Diagnosis present

## 2016-07-09 DIAGNOSIS — C787 Secondary malignant neoplasm of liver and intrahepatic bile duct: Secondary | ICD-10-CM | POA: Diagnosis present

## 2016-07-09 MED ORDER — MORPHINE SULFATE (PF) 4 MG/ML IV SOLN
INTRAVENOUS | Status: AC
  Administered 2016-07-09: 13:00:00
  Filled 2016-07-09: qty 1

## 2016-07-09 MED ORDER — LORAZEPAM 2 MG/ML IJ SOLN: 1.0000 mg | INTRAMUSCULAR | 0 refills | Status: AC | PRN

## 2016-07-09 MED ORDER — HALOPERIDOL LACTATE 5 MG/ML IJ SOLN: 0.5000 mg | INTRAMUSCULAR | Status: AC | PRN

## 2016-07-09 MED ORDER — MORPHINE SULFATE (PF) 4 MG/ML IV SOLN
4.0000 mg | Freq: Once | INTRAVENOUS | Status: AC
  Administered 2016-07-09: 12:00:00 4 mg via INTRAVENOUS

## 2016-07-09 MED ORDER — POLYVINYL ALCOHOL 1.4 % OP SOLN
1.0000 [drp] | Freq: Four times a day (QID) | OPHTHALMIC | 0 refills | Status: AC | PRN
Start: 2016-07-09 — End: ?

## 2016-07-09 MED ORDER — GLYCOPYRROLATE 0.2 MG/ML IJ SOLN: 0.2000 mg | INTRAMUSCULAR | Status: AC | PRN

## 2016-07-09 MED ORDER — MORPHINE SULFATE (PF) 2 MG/ML IV SOLN: 2.0000 mg | INTRAVENOUS | 0 refills | Status: AC | PRN

## 2016-07-09 MED ORDER — BIOTENE DRY MOUTH MT LIQD
15.0000 mL | OROMUCOSAL | Status: AC | PRN
Start: 2016-07-09 — End: ?

## 2016-07-09 MED ORDER — ORAL CARE MOUTH RINSE: 15.0000 mL | Freq: Two times a day (BID) | OROMUCOSAL | 0 refills | Status: AC

## 2016-07-09 MED ORDER — ONDANSETRON HCL 4 MG/2ML IJ SOLN: 4.0000 mg | Freq: Four times a day (QID) | INTRAMUSCULAR | 0 refills | Status: AC | PRN

## 2016-07-09 MED ORDER — HALOPERIDOL LACTATE 2 MG/ML PO CONC: 0.5000 mg | ORAL | 0 refills | Status: AC | PRN

## 2016-07-09 NOTE — Progress Notes (Signed)
Patient discharged hospice home per MD orders. Foley in place and draining. EMS called for transport.

## 2016-07-09 NOTE — Progress Notes (Signed)
New referral for hospice home on 75yo gentleman who was admitted on 10.24.17 with SOB r/t stage IV lung cancer.  Patient has past medical history of GERD, osteoarthritis, anxiety, prostate cancer, alcoholism (past), pancreatitis, degenerative joint disease/lumbar and nephrolithiasis.  He was admitted to [facility] hospice in June 2017 with 3-6 months prognosis.  Patient did not want to undergo radiation or treatment.  He has lived with his brother Laverna Peace for the past 25 years and he acts as his primary caregiver and Media planner.  He has 4 children that are not actively involved in his life per his brother.  Patient is post palliative medicine consult for goals of care.  Patient and family chose comfort care and request admission to the hospice home for symptom management of pain and severe dyspnea.  The patient is on 1L via [place].  He is requiring multiple doses of medications to help manage his severe dyspnea.  This morning he has received Morphine '2mg'$  IV @ 0830 and 1057 with no change in symptoms.  Called and asked Dr. Bridgett Larsson for order for increased dose of Morphine and patient was given '4mg'$  IV at 1200.  He has received Ativan '1mg'$  IV at 0830 and Robinol 0.'2mg'$  IV for secretions. Haldol 0.'5mg'$  IV given at 1200 for agitation.  I spoke with patient's brother Jeneen Rinks Laverna Peace) [place] to discuss hospice home services.  He is in agreement with and ask for patient to be transported to the hospice home.  Consents signed.  Per [facility], they discharged patient upon admission to the hospital.  Patient has bilateral swelling of his hands.  Has a foley catheter and is incontinent of bowel.  Unknown last BM.  He is alert and oriented, but working hard to breath.  Bilateral breath sounds with rhonchi, use of accessory muscles and tachpnea.  He understands he is being transferred to the hospice home to help manage his symptoms.  His brother Laverna Peace, states patient was saved 2 years ago and has not drank or done any drugs since he  accepted [employer].  Patient at this point request comfort care and for his SOB to get better.   Report called to [facility] RN.  EMS called for transport.  Portable DNR to accompany the patient to the hospice home. Thank you for allowing participation in this patient's care.  Dimas Aguas, MA, BSN, RN, FNE Clinical Nurse Liaison Hospice and [facility] [phone]

## 2016-07-09 NOTE — Progress Notes (Signed)
[facility] note Received request from [place] for family interest in [facility] with request for possible transfer today.  No bed availability at this morning, will continue to follow patient and update CSW on available beds. Thank you for referral,  [facility] Liaison RN [phone]

## 2016-07-09 NOTE — Progress Notes (Signed)
4 mg morphine IV once

## 2016-07-09 NOTE — Plan of Care (Signed)
Problem: Education: Goal: Knowledge of [facility] Education information/materials will improve Outcome: Progressing Reported pain 7/10, improved w/ scheduled pain meds + PRN IV Morphine '2mg'$ .  Requested sleep aid, received PRN Remeron '30mg'$ , able to sleep.  No other complaints overnight.  Compliant w/ q2h turns.  Foley care done, draining amber urine.  Call bell within reach, [place].

## 2016-07-09 NOTE — Clinical Social Work Note (Signed)
Clinical Social Work Assessment  Patient Details  Name: Gary Sweeney MRN: [id number] Date of Birth: [birth year]  Date of referral:  07/09/16               Reason for consult:  End of Life/Hospice                Permission sought to share information with:  Other (Hospice liaison ) Permission granted to share information::  Yes, Verbal Permission Granted  Name::        Agency::     Relationship::     Contact Information:     Housing/Transportation Living arrangements for the past 2 months:  Single Family Home Source of Information:  Other (Comment Required) (Brother Medical sales representative) Patient Interpreter Needed:  None Criminal Activity/Legal Involvement Pertinent to Current Situation/Hospitalization:  No - Comment as needed Significant Relationships:  Adult Children, Siblings Lives with:  Siblings Do you feel safe going back to the place where you live?  No Need for family participation in patient care:  Yes (Comment)  Care giving concerns:  Patient lives with his brother Laverna Peace in [place] and is open to [facility] hospice at home.    Social Worker assessment / plan:  Holiday representative (CSW) received residential hospice placement consult from palliative NP. Per consult patient's brother Laverna Peace is the Media planner. CSW contacted patient's brother Laverna Peace to discuss D/C plan. Per brother patient has been living with him in [place] for the past couple of months and is open to [facility]. CSW discussed residential hospice placement. Per Laverna Peace he can't take care of patient at home anymore and he wants to pursue hospice facility placement. CSW provided hospice choice and Laverna Peace asked CSW to send referral to [facility] and [facility] in [place]. Laverna Peace is agreeable to moving patient to the facility that has a bed open today.   Referral was called to [facility] for [place] and to [facility] in [place]. Per Marcene Brawn there is a bed open today for patient at [facility] facility. Per [facility] they do not have a bed open today. Jimmy accepted bed from [facility]. EMS packet is complete and on chart. [facility] liaison will call report and arrange EMS for transport. RN aware of above. Please reconsult if future social work needs arise. CSW signing off.   Employment status:  Retired Nurse, adult PT Recommendations:  Not assessed at this time Information / Referral to community resources:  Other (Comment Required) ([facility] [facility] )  Patient/Family's Response to care:  Patient's brother Laverna Peace accepted bed from [facility].   Patient/Family's Understanding of and Emotional Response to Diagnosis, Current Treatment, and Prognosis:  Patient's brother was pleasant and thanked CSW for assistance.   Emotional Assessment Appearance:  Appears stated age Attitude/Demeanor/Rapport:  Unable to Assess Affect (typically observed):  Unable to Assess Orientation:  Fluctuating Orientation (Suspected and/or reported Sundowners) Alcohol / Substance use:  Not Applicable Psych involvement (Current and /or in the community):  No (Comment)  Discharge Needs  Concerns to be addressed:  Discharge Planning Concerns Readmission within the last 30 days:  No Current discharge risk:  None Barriers to Discharge:  No Barriers Identified   Bevan Vu, Veronia Beets, LCSW 07/09/2016, 12:00 PM

## 2016-07-09 NOTE — Progress Notes (Signed)
Daily Progress Note   Patient Name: Gary Sweeney       Date: 07/09/2016 DOB: September 01, 1941  Age: 75 y.o. MRN#: [id number] Attending Physician: Demetrios Loll, MD Primary Care Physician: Karis Juba, PA-C Admit Date: 07/07/2016  Reason for Consultation/Follow-up: Hospice Evaluation and Terminal Care  Subjective: Upon arrival to room, patient easily arouses to voice but very drowsy. Tells me he is in pain. Per RN, patient has been confused and agitated this morning. PRN ativan, morphine, and robinul recently given.  Brother, Laverna Peace and another relative at bedside. Family agrees with residential hospice facility. Educated on symptom management, trajectory, and expectations at end-of-life. Brother tells me that the patient was saved two years ago and is at peace with dying. Emotional support given.   Length of Stay: 0  Current Medications: Scheduled Meds:  . mouth rinse  15 mL Mouth Rinse BID    Continuous Infusions:    PRN Meds: antiseptic oral rinse, [DISCONTINUED] glycopyrrolate **OR** [DISCONTINUED] glycopyrrolate **OR** glycopyrrolate, [DISCONTINUED] haloperidol **OR** haloperidol **OR** haloperidol lactate, LORazepam, morphine injection, ondansetron **OR** ondansetron (ZOFRAN) IV, polyvinyl alcohol  Physical Exam  Constitutional: He appears cachectic. He is easily aroused. He appears ill.  HENT:  Lips cyanotic  Cardiovascular: Exam reveals distant heart sounds.   Pulmonary/Chest: Tachypnea noted. He has rhonchi.  Audible secretions  Abdominal: Bowel sounds are decreased. There is tenderness.  Musculoskeletal: He exhibits edema (LUE +3, generalized nonpitting).  Neurological: He is easily aroused. He is disoriented.  Easily arouses to voice  Skin: Skin is warm and dry.    Psychiatric: His speech is delayed. Cognition and memory are impaired. He is inattentive.  Nursing note and vitals reviewed.           Vital Signs: BP 104/75 (BP Location: Right Arm)   Pulse 94   Temp 98 F (36.7 C) (Oral)   Resp 16   Ht '5\' 11"'$  (1.803 m)   Wt 74.4 kg (164 lb)   SpO2 91%   BMI 22.87 kg/m  SpO2: SpO2: 91 % O2 Device: O2 Device: Nasal Cannula O2 Flow Rate: O2 Flow Rate (L/min): 4 L/min  Intake/output summary:   Intake/Output Summary (Last 24 hours) at 07/09/16 0953 Last data filed at 07/08/16 2104  Gross per 24 hour  Intake  0 ml  Output              400 ml  Net             -400 ml   LBM: Last BM Date: 07/06/16 Baseline Weight: Weight: 74.7 kg (164 lb 9.6 oz) Most recent weight: Weight: 74.4 kg (164 lb)       Palliative Assessment/Data: PPS 20%   Flowsheet Rows   Flowsheet Row Most Recent Value  Intake Tab  Referral Department  Oncology  Unit at Time of Referral  Med/Surg Unit  Palliative Care Primary Diagnosis  Cancer  Date Notified  07/07/16  Palliative Care Type  New Palliative care  Reason for referral  Clarify Goals of Care, Counsel Regarding Hospice, End of [place], Pain  Date of Admission  07/07/16  Date first seen by Palliative Care  07/08/16  # of days Palliative referral response time  1 Day(s)  # of days IP prior to Palliative referral  0  Clinical Assessment  Palliative Performance Scale Score  20%  Psychosocial & Spiritual Assessment  Palliative Care Outcomes  Patient/Family meeting held?  Yes  Who was at the meeting?  brother and relative  Palliative Care Outcomes  Counseled regarding hospice, Provided end of life care assistance, Provided psychosocial or spiritual support, Transitioned to hospice      Patient Active Problem List   Diagnosis Date Noted  . Pressure injury of skin 07/08/2016  . Palliative care encounter   . Comfort measures only status   . Generalized abdominal pain   . Anxiety  state   . Hypoxia 07/07/2016  . Stage IV squamous cell carcinoma of left lung ([place]) 03/20/2016  . Lung mass 03/12/2016  . GERD (gastroesophageal reflux disease) 01/12/2011  . HYPERLIPIDEMIA-MIXED 02/08/2009  . SEIZURE DISORDER 02/08/2009  . Osteoarthritis of spine 12/05/2008  . ALCOHOLISM 05/25/2008  . Chronic pancreatitis ([place]) 05/25/2008  . ABDOMINAL PAIN-MULTIPLE SITES 05/25/2008  . PERSONAL HX COLONIC POLYPS 05/25/2008  . ANXIETY 05/23/2008  . PANIC DISORDER 05/23/2008  . ARTHRITIS 05/23/2008  . HEADACHE, CHRONIC 05/23/2008  . RENAL CALCULUS, HX OF 05/23/2008    Palliative Care Assessment & Plan   Patient Profile: 75 y.o. male  with past medical history of lung cancer with metastatic liver lesions, prostate cancer, anxiety, osteoarthritis, GERD, diverticulosis, chronic pancreatitis, and alcoholism admitted on 07/07/2016 with shortness of breath and hypoxia. Initially placed on CPAP and then NRB mask. He is a home hospice patient and family requests for comfort measures only. Palliative medicine consultation for hospice evaluation.   Assessment: Acute respiratory failure with hypoxia Stage IV squamous cell carcinoma of left lung with metastatic liver lesions Malnutrition Failure to thrive  Recommendations/Plan:  Patient is actively dying. Comfort measures only.   Eligible for residential hospice. Social work consulted.  Morphine '2mg'$  IV q1h prn pain/dyspnea.  Robinul 0.'2mg'$  IV q4h prn secretions.  Ativan '1mg'$  IV q4h prn anxiety/agitation  Family decline spiritual services/chaplain.   Goals of Care and Additional Recommendations:  Limitations on Scope of Treatment: Full Comfort Care  Code Status: DNR   Code Status Orders        Start     Ordered   07/08/16 0119  Do not attempt resuscitation (DNR)  Continuous    Question Answer Comment  In the event of cardiac or respiratory ARREST Do not call a "code blue"   In the event of cardiac or respiratory ARREST Do  not perform Intubation, CPR, defibrillation or ACLS  In the event of cardiac or respiratory ARREST Use medication by any route, position, wound care, and other measures to relive pain and suffering. May use oxygen, suction and manual treatment of airway obstruction as needed for comfort.      07/08/16 0118    Code Status History    Date Active Date Inactive Code Status Order ID Comments User Context   This patient has a current code status but no historical code status.    Advance Directive Documentation   Flowsheet Row Most Recent Value  Type of Advance Directive  Out of facility DNR (pink MOST or yellow form)  Pre-existing out of facility DNR order (yellow form or pink MOST form)  Yellow form placed in chart (order not valid for inpatient use)  "MOST" Form in Place?  No data       Prognosis:   Hours - Days-in the setting of metastatic lung cancer, failure to thrive, and poor functional and nutritional status.  Discharge Planning:  Hospice facility  Care plan was discussed with patient, family, Dr. Bridgett Larsson, RN, and social work  Thank you for allowing the Palliative Medicine Team to assist in the care of this patient.   Time In: 0900 Time Out: 0935 Total Time 12mn Prolonged Time Billed  no       Greater than 50%  of this time was spent counseling and coordinating care related to the above assessment and plan.  MIhor Dow FNP-C Palliative Medicine Team  Phone: [phone]: [phone] Please contact Palliative Medicine Team phone at [phone] questions and concerns.

## 2016-07-09 NOTE — Discharge Instructions (Signed)
Hospice care

## 2016-07-09 NOTE — Discharge Summary (Signed)
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 Chronic abdominal pain   . Chronic headache   . Chronic pancreatitis ([place])    suspected at EUS  . Diverticulosis   . DJD (degenerative joint disease), lumbar   . GERD (gastroesophageal reflux disease)   . Hospice care 04/2016  . Hx of adenomatous colonic polyps   . Internal hemorrhoid   . Lung mass 03/12/2016  . Nephrolithiasis   . Osteoarthritis   . Panic anxiety syndrome   . Prostate cancer [facility])    s/p xrt  . Shortness of breath dyspnea   . Stage III squamous cell carcinoma of left lung ([place]) 03/20/2016  . Syncope    states he blacks out occasionally   HOSPITAL COURSE:   Acute respiratory failure with hypoxia - possible due to pneumonia versus pleural effusion Family does not wish for any aggressive treatment, including antibiotics. They've opted for comfort measures. Continue O2 [place] 1-2 L.   Elevated troponin, due to above.  ARF due to dehydration.  Stage IV squamous cell carcinoma of left lung ([place]) - very poor prognosis Pain control, supportive care, discharge hospice home today.  I discussed with palliative team.  DISCHARGE CONDITIONS:  Very poor prognosis,  discharge to hospice home today. CONSULTS OBTAINED:   DRUG ALLERGIES:   Allergies  Allergen Reactions  . Codeine Other (See Comments)    Reaction: unknown  . Tylenol [Acetaminophen] Itching   DISCHARGE MEDICATIONS:     Medication List    STOP taking these medications   ALPRAZolam 1 MG tablet Commonly known as:  XANAX   celecoxib 200 MG capsule Commonly known as:  CELEBREX   dexamethasone 2 MG tablet Commonly known as:  DECADRON   gabapentin 300 MG capsule Commonly known as:  NEURONTIN   mirtazapine 30 MG tablet Commonly known as:  REMERON   morphine 15 MG tablet Commonly known as:  MSIR Replaced by:  morphine 2 MG/ML injection   morphine 60 MG 12 hr tablet Commonly known as:  MS CONTIN   omeprazole 40 MG capsule Commonly known as:  PRILOSEC   senna 8.6 MG Tabs tablet Commonly known as:  SENOKOT     TAKE these medications   glycopyrrolate 0.2 MG/ML injection Commonly known as:  ROBINUL Inject 1 mL (0.2 mg total) into the vein every 4 (four) hours as needed (excessive secretions).   haloperidol 2 MG/ML solution Commonly known as:  HALDOL Place 0.3 mLs (0.6 mg total) under the tongue every 4 (four) hours as needed for agitation (or delirium).   haloperidol lactate 5 MG/ML injection Commonly known as:  HALDOL Inject 0.1 mLs (0.5 mg total) into the vein every 4 (four) hours as needed (or delirium).  LORazepam 2 MG/ML injection Commonly known as:  ATIVAN Inject 0.5 mLs (1 mg total) into the vein every 4 (four) hours as needed for anxiety (agitation).   morphine 2 MG/ML injection Inject 1 mL (2 mg total) into the vein every hour as needed (dyspnea). Replaces:  morphine 15 MG tablet   mouth rinse Liqd solution 15 mLs by Mouth Rinse route 2 (two) times daily.   antiseptic oral rinse Liqd Apply 15 mLs topically as needed for dry mouth.   ondansetron 4 MG/2ML Soln injection Commonly known as:  ZOFRAN Inject 2 mLs (4 mg total) into the vein every 6  (six) hours as needed for nausea.   polyvinyl alcohol 1.4 % ophthalmic solution Commonly known as:  LIQUIFILM TEARS Place 1 drop into both eyes 4 (four) times daily as needed for dry eyes.        DISCHARGE INSTRUCTIONS:  See AVS.  If you experience worsening of your admission symptoms, develop shortness of breath, life threatening emergency, suicidal or homicidal thoughts you must seek medical attention immediately by calling 911 or calling your MD immediately  if symptoms less severe.  You Must read complete instructions/literature along with all the possible adverse reactions/side effects for all the Medicines you take and that have been prescribed to you. Take any new Medicines after you have completely understood and accpet all the possible adverse reactions/side effects.   Please note  You were cared for by a hospitalist during your hospital stay. If you have any questions about your discharge medications or the care you received while you were in the hospital after you are discharged, you can call the unit and asked to speak with the hospitalist on call if the hospitalist that took care of you is not available. Once you are discharged, your primary care physician will handle any further medical issues. Please note that NO REFILLS for any discharge medications will be authorized once you are discharged, as it is imperative that you return to your primary care physician (or establish a relationship with a primary care physician if you do not have one) for your aftercare needs so that they can reassess your need for medications and monitor your lab values.    On the day of Discharge:  VITAL SIGNS:  Blood pressure 104/75, pulse 94, temperature 98 F (36.7 C), temperature source Oral, resp. rate 16, height '5\' 11"'$  (1.803 m), weight 164 lb (74.4 kg), SpO2 91 %.  DATA REVIEW:   CBC  Recent Labs Lab 07/07/16 2158  WBC 14.2*  HGB 12.8*  HCT 39.1*  PLT 159    Chemistries    Recent Labs Lab 07/07/16 2158  NA 138  K 4.8  CL 106  CO2 22  GLUCOSE 225*  BUN 33*  CREATININE 1.30*  CALCIUM 7.4*  AST 66*  ALT 65*  ALKPHOS 91  BILITOT 0.9     Microbiology Results  Results for orders placed or performed during the hospital encounter of 03/28/08  Urine culture     Status: None   Collection Time: 03/28/08  3:41 PM  Result Value Ref Range Status   Specimen Description URINE, RANDOM  Final   Special Requests NONE  Final   Colony Count NO GROWTH  Final   Culture NO GROWTH  Final   Report Status 03/30/2008 FINAL  Final    RADIOLOGY:  No results found.   Management plans discussed with the patient's brother and they are in agreement.  CODE STATUS:  Code Status Orders        Start     Ordered   07/08/16 0119  Do not attempt resuscitation (DNR)  Continuous    Question Answer Comment  In the event of cardiac or respiratory ARREST Do not call a "code blue"   In the event of cardiac or respiratory ARREST Do not perform Intubation, CPR, defibrillation or ACLS   In the event of cardiac or respiratory ARREST Use medication by any route, position, wound care, and other measures to relive pain and suffering. May use oxygen, suction and manual treatment of airway obstruction as needed for comfort.      07/08/16 0118    Code Status History    Date Active Date Inactive Code Status Order ID Comments User Context   This patient has a current code status but no historical code status.    Advance Directive Documentation   Flowsheet Row Most Recent Value  Type of Advance Directive  Out of facility DNR (pink MOST or yellow form)  Pre-existing out of facility DNR order (yellow form or pink MOST form)  Yellow form placed in chart (order not valid for inpatient use)  "MOST" Form in Place?  No data      TOTAL TIME TAKING CARE OF THIS PATIENT: 36 minutes.    Demetrios Loll M.D on 07/09/2016 at 10:56 AM  Between 7am to 6pm - Pager - [phone]  After  6pm go to [email or web address] - Proofreader  [facility]  Office  [phone]  CC: Primary care physician; Karis Juba, PA-C   Note: This dictation was prepared with Dragon dictation along with smaller phrase technology. Any transcriptional errors that result from this process are unintentional.

## 2016-07-15 DEATH — deceased

## 2016-09-05 IMAGING — MR MR HEAD WO/W CM
11 of 15 series · 33 of 48 positions shown · IV contrast (multihance)
Comparison: Head CTs without contrast 12/13/2008 and earlier.

CLINICAL DATA: 75-year-old male with squamous cell lung cancer.
Staging. Subsequent encounter.

EXAM:
MRI HEAD WITHOUT AND WITH CONTRAST
TECHNIQUE: Multiplanar, multiecho pulse sequences of the brain and surrounding
structures were obtained without and with intravenous contrast.
CONTRAST:  10mL MULTIHANCE GADOBENATE DIMEGLUMINE 529 MG/ML IV SOLN

[Series 4: DWI · axial · 3.0mm · 1.09mm/px · z∈[-57,+97]mm · 9 of 106 slices shown (1 of 4)]
[im 1/106]
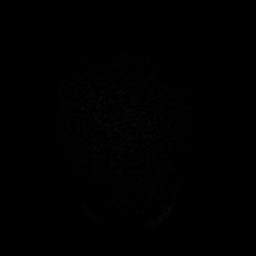
[im 14/106]
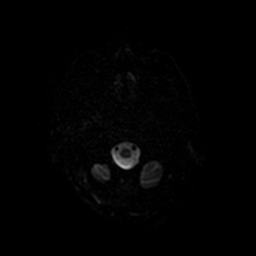
[im 27/106]
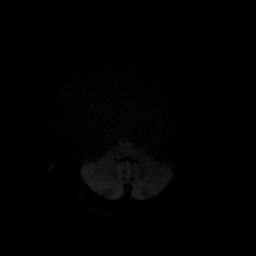
[im 40/106]
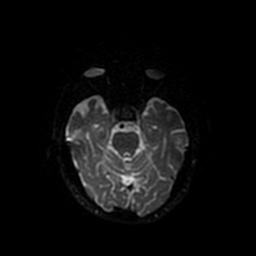
[im 53/106]
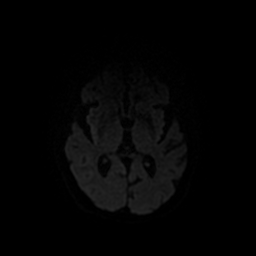
[im 66/106]
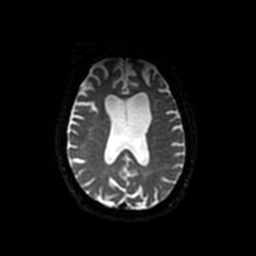
[im 79/106]
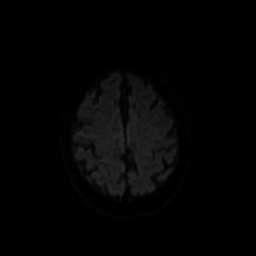
[im 92/106]
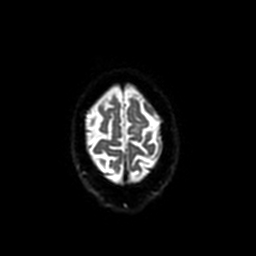
[im 106/106]
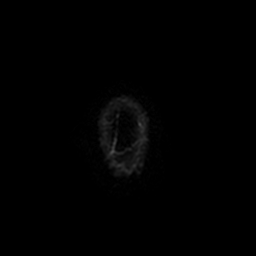

[Series 5: DWI · coronal · 5.0mm · 1.09mm/px · 5 of 66 slices shown (2 of 4)]
[im 1/66]
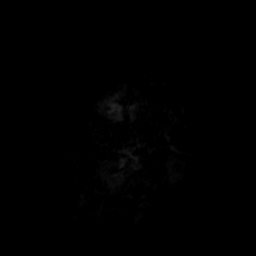
[im 17/66]
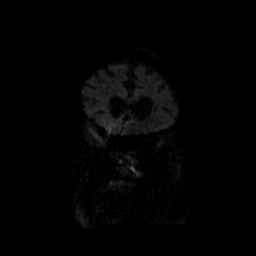
[im 33/66]
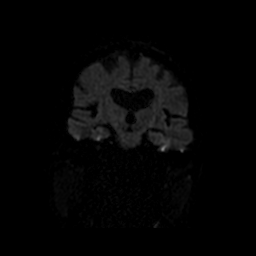
[im 49/66]
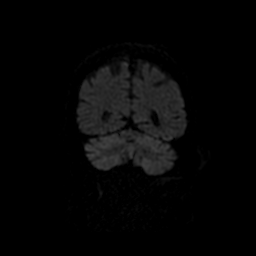
[im 66/66]
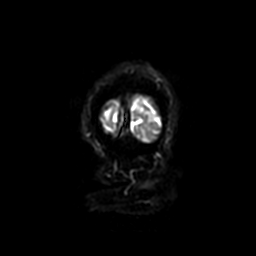

[Series 6: T2 · coronal · 3.0mm · 0.39mm/px · 2 of 28 slices shown (1 of 2)]
[im 1/28]
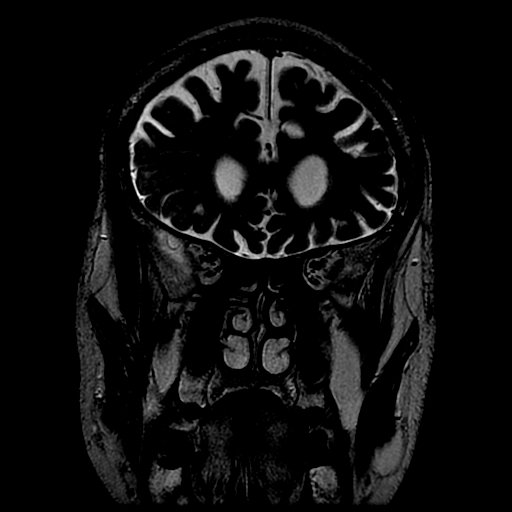
[im 28/28]
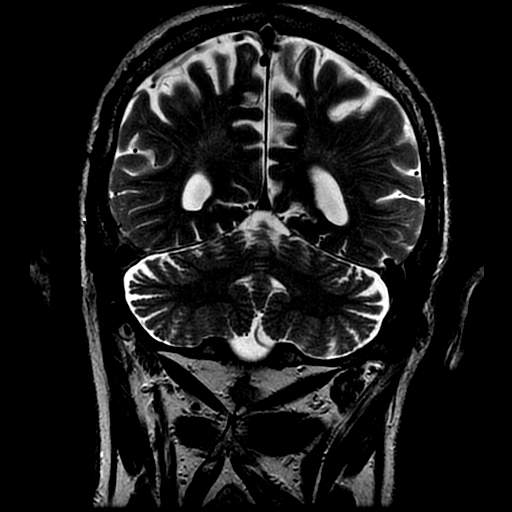

[Series 7: T2 · axial · 5.0mm · 0.43mm/px · z∈[-52,+104]mm · 2 of 25 slices shown (2 of 2)]
[im 1/25]
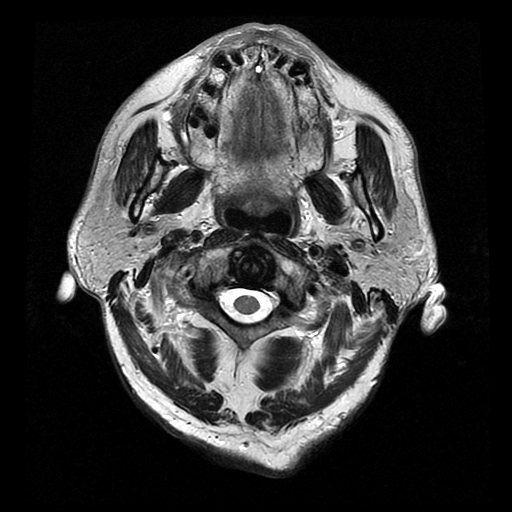
[im 25/25]
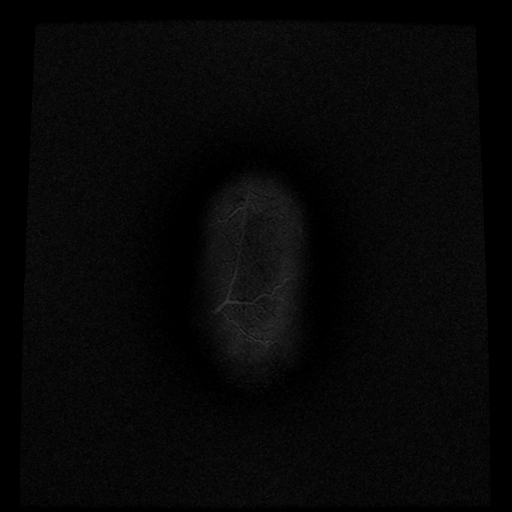

[Series 8: FLAIR · axial · 5.0mm · 0.43mm/px · 1 of 13 slices shown (1 of 2)]
[im 1/13]
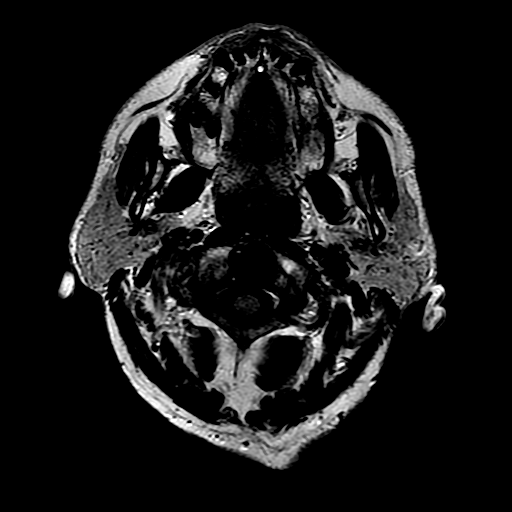

[Series 11: FLAIR · axial · 5.0mm · 0.43mm/px · z∈[-52,+104]mm · 2 of 25 slices shown (2 of 2)]
[im 1/25]
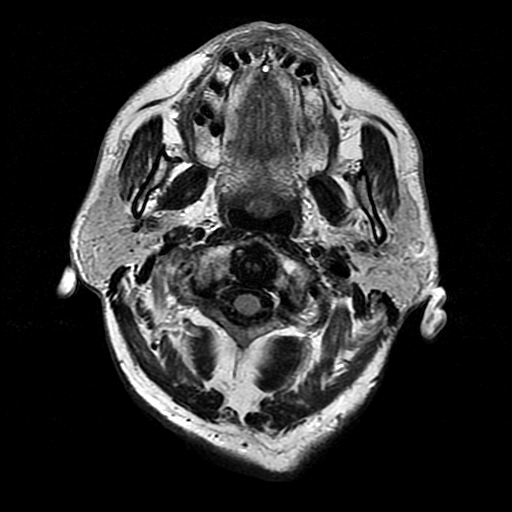
[im 25/25]
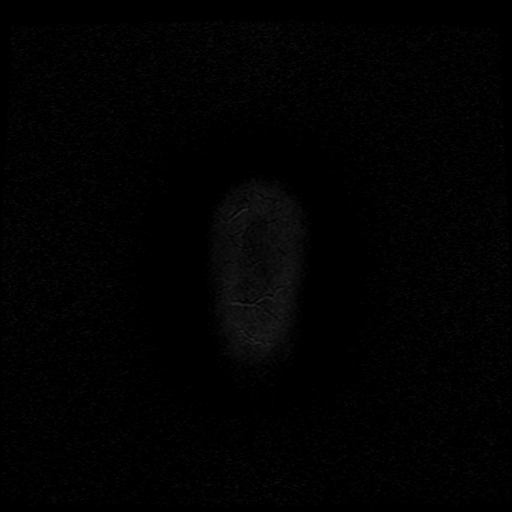

[Series 12: T2 post-contrast · coronal · 5.0mm · 0.45mm/px · 1 of 27 slices shown]
[im 1/27]
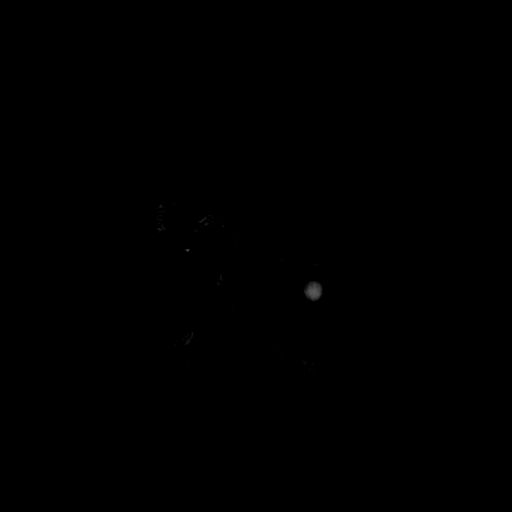

[Series 14: T1 post-contrast · coronal · 5.0mm · 0.45mm/px · 2 of 27 slices shown (1 of 2)]
[im 1/27]
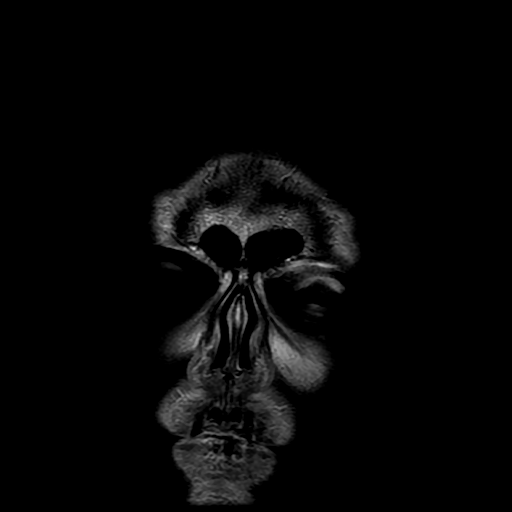
[im 27/27]
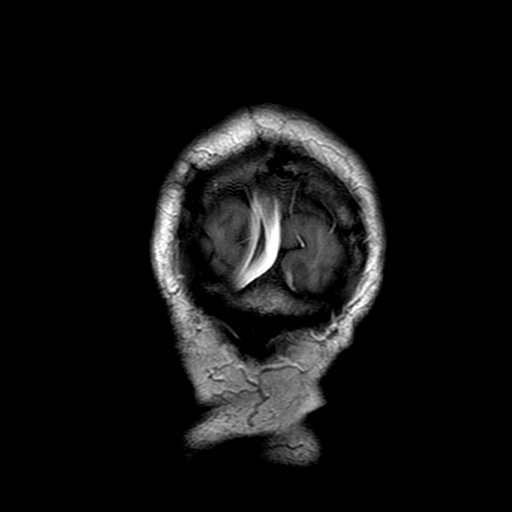

[Series 15: T1 post-contrast · sagittal · 5.0mm · 0.47mm/px · 2 of 24 slices shown (2 of 2)]
[im 1/24]
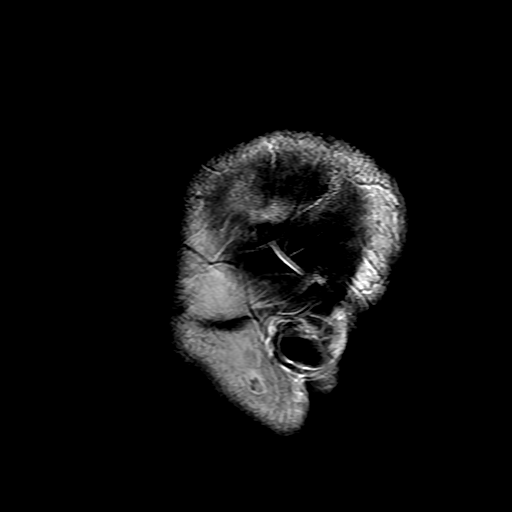
[im 24/24]
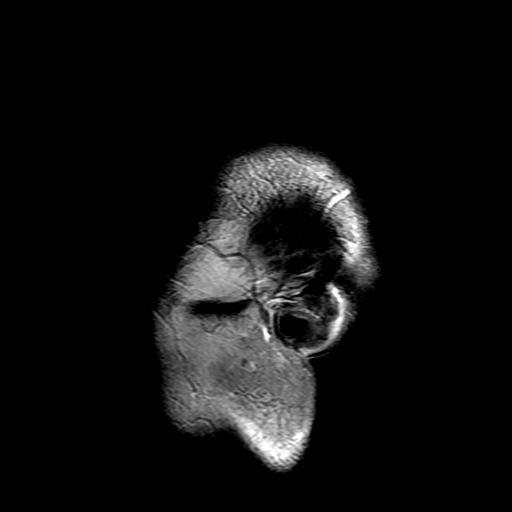

[Series 400: DWI · axial · 3.0mm · 1.09mm/px · z∈[-57,+97]mm · 4 of 53 slices shown (3 of 4)]
[im 1/53]
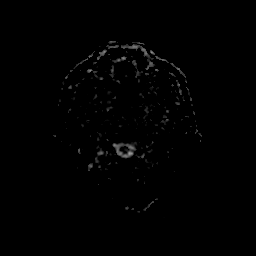
[im 18/53]
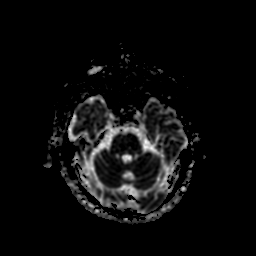
[im 35/53]
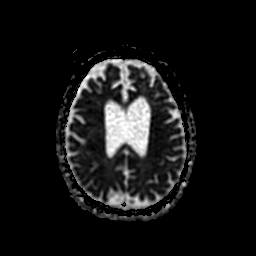
[im 53/53]
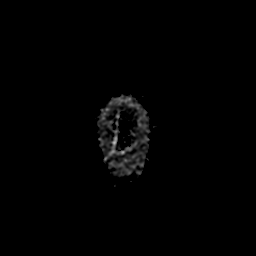

[Series 500: DWI · coronal · 5.0mm · 1.09mm/px · 3 of 33 slices shown (4 of 4)]
[im 1/33]
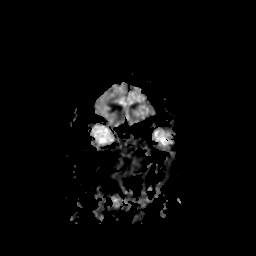
[im 17/33]
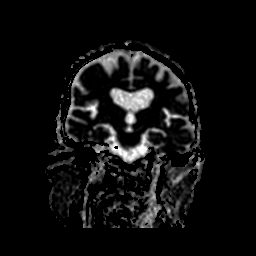
[im 33/33]
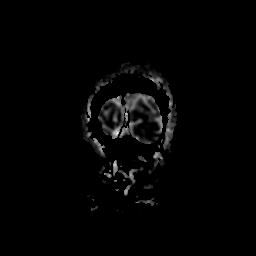

[33 of 48 positions shown; findings below may reference images not displayed]

FINDINGS: No abnormal enhancement identified. No midline shift, mass effect,
or evidence of intracranial mass lesion. No dural thickening.

Visible bone marrow signal is normal. Negative visualized cervical
spine and spinal cord.

No restricted diffusion to suggest acute infarction. No
ventriculomegaly, extra-axial collection or acute intracranial
hemorrhage. Cervicomedullary junction and pituitary are within
normal limits. Major intracranial vascular flow voids are preserved.
Dominant distal left vertebral artery. Chronic left caudate infarct
with ex vacuo enlargement of the adjacent left lateral ventricle.
Mild associated left basal ganglia hemosiderin. No other discrete
encephalomalacia. Largely unremarkable for age gray and white matter
signal elsewhere.

Visible internal auditory structures appear normal. Visualized
paranasal sinuses and mastoids are well pneumatized. Postoperative
changes to both globes. Negative orbit and scalp soft tissues.
IMPRESSION: 1.  No metastatic disease or acute intracranial abnormality.
2. Chronic left caudate lacunar infarct.
